# Patient Record
Sex: Female | Born: 1955 | Race: White | Hispanic: No | Smoking: Never smoker
Health system: Southern US, Community
[De-identification: ages and names within clinical notes are randomized; demographics above are authoritative.]

## PROBLEM LIST (undated history)

## (undated) HISTORY — PX: BREAST BIOPSY: SHX20

## (undated) HISTORY — PX: HERNIA REPAIR: SHX51

## (undated) HISTORY — PX: APPENDECTOMY: SHX54

---

## 1998-03-04 ENCOUNTER — Other Ambulatory Visit: Admission: RE | Admit: 1998-03-04 | Discharge: 1998-03-04 | Payer: Self-pay | Admitting: Obstetrics and Gynecology

## 1998-11-12 ENCOUNTER — Ambulatory Visit (HOSPITAL_BASED_OUTPATIENT_CLINIC_OR_DEPARTMENT_OTHER): Admission: RE | Admit: 1998-11-12 | Discharge: 1998-11-12 | Payer: Self-pay | Admitting: General Surgery

## 1999-05-06 ENCOUNTER — Other Ambulatory Visit: Admission: RE | Admit: 1999-05-06 | Discharge: 1999-05-06 | Payer: Self-pay | Admitting: Obstetrics and Gynecology

## 1999-05-26 ENCOUNTER — Encounter: Payer: Self-pay | Admitting: Obstetrics and Gynecology

## 2000-05-31 ENCOUNTER — Encounter: Payer: Self-pay | Admitting: Family Medicine

## 2000-05-31 ENCOUNTER — Other Ambulatory Visit: Admission: RE | Admit: 2000-05-31 | Discharge: 2000-05-31 | Payer: Self-pay | Admitting: Obstetrics and Gynecology

## 2000-05-31 ENCOUNTER — Ambulatory Visit (HOSPITAL_COMMUNITY): Admission: RE | Admit: 2000-05-31 | Discharge: 2000-05-31 | Payer: Self-pay | Admitting: Family Medicine

## 2001-06-05 ENCOUNTER — Other Ambulatory Visit: Admission: RE | Admit: 2001-06-05 | Discharge: 2001-06-05 | Payer: Self-pay | Admitting: Obstetrics and Gynecology

## 2002-06-10 ENCOUNTER — Other Ambulatory Visit: Admission: RE | Admit: 2002-06-10 | Discharge: 2002-06-10 | Payer: Self-pay | Admitting: Obstetrics and Gynecology

## 2003-03-08 ENCOUNTER — Emergency Department (HOSPITAL_COMMUNITY): Admission: EM | Admit: 2003-03-08 | Discharge: 2003-03-08 | Payer: Self-pay | Admitting: Emergency Medicine

## 2003-12-17 ENCOUNTER — Other Ambulatory Visit: Admission: RE | Admit: 2003-12-17 | Discharge: 2003-12-17 | Payer: Self-pay | Admitting: Obstetrics and Gynecology

## 2004-01-08 ENCOUNTER — Ambulatory Visit (HOSPITAL_COMMUNITY): Admission: RE | Admit: 2004-01-08 | Discharge: 2004-01-08 | Payer: Self-pay | Admitting: Obstetrics and Gynecology

## 2005-03-07 ENCOUNTER — Other Ambulatory Visit: Admission: RE | Admit: 2005-03-07 | Discharge: 2005-03-07 | Payer: Self-pay | Admitting: Obstetrics and Gynecology

## 2005-03-28 ENCOUNTER — Ambulatory Visit (HOSPITAL_COMMUNITY): Admission: RE | Admit: 2005-03-28 | Discharge: 2005-03-28 | Payer: Self-pay | Admitting: Obstetrics and Gynecology

## 2005-04-11 ENCOUNTER — Encounter: Admission: RE | Admit: 2005-04-11 | Discharge: 2005-04-11 | Payer: Self-pay | Admitting: Obstetrics and Gynecology

## 2005-04-22 ENCOUNTER — Other Ambulatory Visit: Admission: RE | Admit: 2005-04-22 | Discharge: 2005-04-22 | Payer: Self-pay | Admitting: Obstetrics and Gynecology

## 2006-07-05 ENCOUNTER — Encounter: Admission: RE | Admit: 2006-07-05 | Discharge: 2006-07-05 | Payer: Self-pay | Admitting: Obstetrics and Gynecology

## 2006-08-24 ENCOUNTER — Encounter: Admission: RE | Admit: 2006-08-24 | Discharge: 2006-08-24 | Payer: Self-pay | Admitting: Interventional Radiology

## 2010-05-22 ENCOUNTER — Encounter: Payer: Self-pay | Admitting: Obstetrics and Gynecology

## 2010-05-23 ENCOUNTER — Encounter: Payer: Self-pay | Admitting: Interventional Radiology

## 2011-07-13 ENCOUNTER — Other Ambulatory Visit: Payer: Self-pay | Admitting: Radiology

## 2014-07-15 ENCOUNTER — Other Ambulatory Visit: Payer: Self-pay | Admitting: Family Medicine

## 2014-07-15 DIAGNOSIS — Z1231 Encounter for screening mammogram for malignant neoplasm of breast: Secondary | ICD-10-CM

## 2014-07-22 ENCOUNTER — Other Ambulatory Visit: Payer: Self-pay | Admitting: Family Medicine

## 2014-07-22 DIAGNOSIS — N644 Mastodynia: Secondary | ICD-10-CM

## 2014-07-24 ENCOUNTER — Ambulatory Visit: Payer: Self-pay

## 2014-07-24 ENCOUNTER — Ambulatory Visit
Admission: RE | Admit: 2014-07-24 | Discharge: 2014-07-24 | Disposition: A | Discharge status: Home / Self Care | Payer: BLUE CROSS/BLUE SHIELD | Source: Ambulatory Visit | Attending: Family Medicine | Admitting: Family Medicine

## 2014-07-24 DIAGNOSIS — N644 Mastodynia: Secondary | ICD-10-CM

## 2015-03-09 ENCOUNTER — Other Ambulatory Visit: Payer: Self-pay | Admitting: Family Medicine

## 2015-03-09 DIAGNOSIS — N6320 Unspecified lump in the left breast, unspecified quadrant: Secondary | ICD-10-CM

## 2015-03-17 ENCOUNTER — Other Ambulatory Visit: Payer: Self-pay | Admitting: Family Medicine

## 2015-03-17 ENCOUNTER — Ambulatory Visit
Admission: RE | Admit: 2015-03-17 | Discharge: 2015-03-17 | Disposition: A | Discharge status: Home / Self Care | Payer: BLUE CROSS/BLUE SHIELD | Source: Ambulatory Visit | Attending: Family Medicine | Admitting: Family Medicine

## 2015-03-17 DIAGNOSIS — N6320 Unspecified lump in the left breast, unspecified quadrant: Secondary | ICD-10-CM

## 2015-05-19 ENCOUNTER — Encounter: Payer: Self-pay | Admitting: Cardiovascular Disease

## 2015-05-19 ENCOUNTER — Ambulatory Visit (INDEPENDENT_AMBULATORY_CARE_PROVIDER_SITE_OTHER): Payer: BLUE CROSS/BLUE SHIELD | Admitting: Cardiovascular Disease

## 2015-05-19 VITALS — BP 100/70 | HR 52 | Ht 65.5 in | Wt 134.8 lb

## 2015-05-19 DIAGNOSIS — I493 Ventricular premature depolarization: Secondary | ICD-10-CM | POA: Diagnosis not present

## 2015-05-19 DIAGNOSIS — R002 Palpitations: Secondary | ICD-10-CM

## 2015-05-19 NOTE — Progress Notes (Signed)
Cardiology Office Note   Date:  05/19/2015   ID:  [name], DOB 26-Sep-1955, MRN [id number]  PCP:  No primary care provider on file.  Cardiologist:   Marcy Bogosian, Deloris Ping, MD   Chief Complaint  Patient presents with  . Palpitations   Problem List 1. Palpitations    History of Present Illness: Kiara Lewis is a 60 y.o. female who presents for palpitations. These occur primarily at night. Not during the day .   Stays busy during the day . Lots of exercise.   Yoga 3 times a week.  Plays tennis 3-4 times a week .  Speed walks several times a week . Notices the palpitations every 3-4 weeks. Typically last only 1 second.   Has had them for years.  Not any worse now. Just thought it was time to check out the palpitations  No CP or dyspnea while exercising .     History reviewed. No pertinent past medical history.  Past Surgical History  Procedure Laterality Date  . Appendectomy    . Hernia repair       Current Outpatient Prescriptions  Medication Sig Dispense Refill  . cholecalciferol (VITAMIN D) 1000 units tablet Take 2,000 Units by mouth daily.    Marland Kitchen CINNAMON PO Take 1 tablet by mouth daily.    Marland Kitchen estradiol (ESTRACE) 2 MG tablet Take 2 mg by mouth daily.    . Fish Oil-Cholecalciferol (FISH OIL + D3) 1000-1000 MG-UNIT CAPS Take 1 tablet by mouth daily.    Marland Kitchen l-methylfolate-B6-B12 (METANX) 3-35-2 MG TABS tablet Take 1 tablet by mouth daily.    . magnesium 30 MG tablet Take 30 mg by mouth 2 (two) times daily.    . progesterone (PROMETRIUM) 100 MG capsule Take 100 mg by mouth daily.    . Thyroid (NATURE-THROID) 16.25 MG TABS Take 1 tablet by mouth daily.    . Turmeric Curcumin 500 MG CAPS Take 1 capsule by mouth daily.     No current facility-administered medications for this visit.    Allergies:   Review of patient's allergies indicates no known allergies.    Social History:  The patient  reports that she has never smoked. She does not have any smokeless tobacco  history on file. She reports that she drinks alcohol. She reports that she does not use illicit drugs.   Family History:  The patient's family history includes Atrial fibrillation in her brother and father; Cardiomyopathy in her brother; Diabetes type II in her mother; Rheumatic fever in her father.  Brother has hypertrophic cardiomyopathy .    ROS:  Please see the history of present illness.    Review of Systems: Constitutional:  denies fever, chills, diaphoresis, appetite change and fatigue.  HEENT: denies photophobia, eye pain, redness, hearing loss, ear pain, congestion, sore throat, rhinorrhea, sneezing, neck pain, neck stiffness and tinnitus.  Respiratory: denies SOB, DOE, cough, chest tightness, and wheezing.  Cardiovascular: denies chest pain, palpitations and leg swelling.  Gastrointestinal: denies nausea, vomiting, abdominal pain, diarrhea, constipation, blood in stool.  Genitourinary: denies dysuria, urgency, frequency, hematuria, flank pain and difficulty urinating.  Musculoskeletal: denies  myalgias, back pain, joint swelling, arthralgias and gait problem.   Skin: denies pallor, rash and wound.  Neurological: denies dizziness, seizures, syncope, weakness, light-headedness, numbness and headaches.   Hematological: denies adenopathy, easy bruising, personal or family bleeding history.  Psychiatric/ Behavioral: denies suicidal ideation, mood changes, confusion, nervousness, sleep disturbance and agitation.       All  other systems are reviewed and negative.    PHYSICAL EXAM: VS:  BP 100/70 mmHg  Pulse 52  Ht 5' 5.5" (1.664 m)  Wt 134 lb 12.8 oz (61.145 kg)  BMI 22.08 kg/m2 , BMI Body mass index is 22.08 kg/(m^2). GEN: Well nourished, well developed, in no acute distress HEENT: normal Neck: no JVD, carotid bruits, or masses Cardiac: RRR; no murmurs, rubs, or gallops,no edema  Respiratory:  clear to auscultation bilaterally, normal work of breathing GI: soft, nontender,  nondistended, + BS MS: no deformity or atrophy Skin: warm and dry, no rash Neuro:  Strength and sensation are intact Psych: normal   EKG:  EKG is ordered today. The ekg ordered today demonstrates sinus brady at 52.  Otherwise normal .   Recent Labs: No results found for requested labs within last 365 days.    Lipid Panel No results found for: CHOL, TRIG, HDL, CHOLHDL, VLDL, LDLCALC, LDLDIRECT    Wt Readings from Last 3 Encounters:  05/19/15 134 lb 12.8 oz (61.145 kg)      Other studies Reviewed: Additional studies/ records that were reviewed today include: . Review of the above records demonstrates:    ASSESSMENT AND PLAN:  1.  Palpitations: Kiara Lewis presents with episodes of palpitations that are clinically consistent with premature ventricular contractions. These typically occur more in the evening and do not typically occur at night. They last for only one second. She also has muscle cramps. I suspect that her potassium levels may be a little bit low. I suggested to her that she drink V8 juice once a day.  These palpitations certainly sounds very benign. They do not cause her any chest pain, shortness breath, syncope, or presyncope. She is able to percuss pain in competitive sports without any limitations of shortness of breath or chest pain.  At this time I do not think that she needs any additional workup. I'll be happy to see her in the future if she has any additional problems. Her brother and her father have atrial fibrillation. I've advised her to watch for any prolonged episodes of palpitations and told her that I would be happy to see her again if she has any prolonged episodes of heartbeat irregularities.   Current medicines are reviewed at length with the patient today.  The patient does not have concerns regarding medicines.  The following changes have been made:  no change  Labs/ tests ordered today include:  No orders of the defined types were placed in  this encounter.     Disposition:   FU with me as needed.     Sachi Boulay, Deloris Ping, MD  05/19/2015 10:22 AM    [facility] [address] [place], [place], [place]  [address] Phone: [phone]; Fax: [phone]   [facility]  [address] [place], [place]  [address] [phone]   Fax [phone]

## 2015-05-19 NOTE — Patient Instructions (Signed)
Medication Instructions:  Your physician recommends that you continue on your current medications as directed. Please refer to the Current Medication list given to you today.   Labwork: None Ordered   Testing/Procedures: None Ordered   Follow-Up: Your physician recommends that you schedule a follow-up appointment in: as needed with Dr. Nahser   If you need a refill on your cardiac medications before your next appointment, please call your pharmacy.   Thank you for choosing [facility]! Roshard Rezabek, RN [phone]    

## 2015-05-20 ENCOUNTER — Encounter: Payer: Self-pay | Admitting: Nurse Practitioner

## 2015-05-22 ENCOUNTER — Encounter: Payer: Self-pay | Admitting: Cardiovascular Disease

## 2015-10-28 DIAGNOSIS — E559 Vitamin D deficiency, unspecified: Secondary | ICD-10-CM | POA: Diagnosis not present

## 2015-10-28 DIAGNOSIS — E039 Hypothyroidism, unspecified: Secondary | ICD-10-CM | POA: Diagnosis not present

## 2015-10-28 DIAGNOSIS — N951 Menopausal and female climacteric states: Secondary | ICD-10-CM | POA: Diagnosis not present

## 2015-10-28 DIAGNOSIS — R002 Palpitations: Secondary | ICD-10-CM | POA: Diagnosis not present

## 2015-11-11 DIAGNOSIS — E039 Hypothyroidism, unspecified: Secondary | ICD-10-CM | POA: Diagnosis not present

## 2015-11-11 DIAGNOSIS — R6882 Decreased libido: Secondary | ICD-10-CM | POA: Diagnosis not present

## 2015-11-11 DIAGNOSIS — N951 Menopausal and female climacteric states: Secondary | ICD-10-CM | POA: Diagnosis not present

## 2016-01-10 DIAGNOSIS — M25552 Pain in left hip: Secondary | ICD-10-CM | POA: Diagnosis not present

## 2016-01-10 DIAGNOSIS — M791 Myalgia: Secondary | ICD-10-CM | POA: Diagnosis not present

## 2016-01-10 DIAGNOSIS — Z6822 Body mass index (BMI) 22.0-22.9, adult: Secondary | ICD-10-CM | POA: Diagnosis not present

## 2016-01-10 DIAGNOSIS — S76312A Strain of muscle, fascia and tendon of the posterior muscle group at thigh level, left thigh, initial encounter: Secondary | ICD-10-CM | POA: Diagnosis not present

## 2016-01-14 DIAGNOSIS — M545 Low back pain: Secondary | ICD-10-CM | POA: Diagnosis not present

## 2016-01-14 DIAGNOSIS — Z6822 Body mass index (BMI) 22.0-22.9, adult: Secondary | ICD-10-CM | POA: Diagnosis not present

## 2016-01-25 DIAGNOSIS — M25552 Pain in left hip: Secondary | ICD-10-CM | POA: Diagnosis not present

## 2016-01-25 DIAGNOSIS — G5792 Unspecified mononeuropathy of left lower limb: Secondary | ICD-10-CM | POA: Diagnosis not present

## 2016-01-25 DIAGNOSIS — Z6822 Body mass index (BMI) 22.0-22.9, adult: Secondary | ICD-10-CM | POA: Diagnosis not present

## 2016-01-25 DIAGNOSIS — M79605 Pain in left leg: Secondary | ICD-10-CM | POA: Diagnosis not present

## 2016-01-26 DIAGNOSIS — M545 Low back pain: Secondary | ICD-10-CM | POA: Diagnosis not present

## 2016-02-17 ENCOUNTER — Ambulatory Visit (INDEPENDENT_AMBULATORY_CARE_PROVIDER_SITE_OTHER): Payer: BLUE CROSS/BLUE SHIELD | Admitting: Family Medicine

## 2016-02-17 ENCOUNTER — Ambulatory Visit: Payer: Self-pay

## 2016-02-17 ENCOUNTER — Encounter: Payer: Self-pay | Admitting: Family Medicine

## 2016-02-17 VITALS — BP 114/82 | HR 66 | Wt 130.0 lb

## 2016-02-17 DIAGNOSIS — G5702 Lesion of sciatic nerve, left lower limb: Secondary | ICD-10-CM | POA: Diagnosis not present

## 2016-02-17 DIAGNOSIS — M25552 Pain in left hip: Secondary | ICD-10-CM

## 2016-02-17 DIAGNOSIS — S76312A Strain of muscle, fascia and tendon of the posterior muscle group at thigh level, left thigh, initial encounter: Secondary | ICD-10-CM | POA: Diagnosis not present

## 2016-02-17 MED ORDER — NITROGLYCERIN 0.2 MG/HR TD PT24: MEDICATED_PATCH | TRANSDERMAL | 1 refills | Status: DC

## 2016-02-17 MED ORDER — GABAPENTIN 100 MG PO CAPS: 200.0000 mg | ORAL_CAPSULE | Freq: Every day | ORAL | 3 refills | Status: DC

## 2016-02-17 MED ORDER — DICLOFENAC SODIUM 2 % TD SOLN: TRANSDERMAL | 3 refills | Status: DC

## 2016-02-17 NOTE — Progress Notes (Signed)
[facility] [address] [place], [place] [address] Phone: [phone] Subjective:    CC: Back, hip and leg pain  YQM:[id number]  Kiara Lewis is a 60 y.o. female coming in with complaint of back, hip and leg pain. Seems to be left-sided. Patient states this all started when she was pushing furniture. Left leg felt a snap in the back of the leg. Having difficult he with daily activities including working as well as driving. Seems to radiate down the posterior aspect the leg to the knee. Mild numbness but denies any weakness. Patient states that she did have some mild response to the steroid as well as a muscle relaxer. Patient went to another specialist. Was diagnosed with more of a piriformis syndrome and has been doing stretching regularly with no significant improvement. States that she continues to have a pain that seems to radiate down the posterior aspect the leg down to her knee. Rates the severity of pain a 6 out of 10. Given her from playing tennis. Is able to walk. States though that she can wake up in the middle of the night with spasming. No significant back pain.   patient was seen by [facility] one month ago. Patient did have x-rays of her left hip done. These were independently visualized by me. Patient did not have any significant osteoarthritic changes but was found to have calcified fibroid.  No past medical history on file. Past Surgical History:  Procedure Laterality Date  . APPENDECTOMY    . HERNIA REPAIR     Social History   Social History  . Marital status: Divorced    Spouse name: N/A  . Number of children: N/A  . Years of education: N/A   Social History Main Topics  . Smoking status: Never Smoker  . Smokeless tobacco: Not on file  . Alcohol use Yes     Comment: OCCASSIONALLY  . Drug use: No  . Sexual activity: Not on file   Other Topics Concern  . Not on file   Social History Narrative  . No narrative on file   No Known  Allergies Family History  Problem Relation Age of Onset  . Diabetes type II Mother   . Atrial fibrillation Father   . Rheumatic fever Father   . Atrial fibrillation Brother   . Cardiomyopathy Brother     Past medical history, social, surgical and family history all reviewed in electronic medical record.  No pertanent information unless stated regarding to the chief complaint.   Review of Systems: No headache, visual changes, nausea, vomiting, diarrhea, constipation, dizziness, abdominal pain, skin rash, fevers, chills, night sweats, weight loss, swollen lymph nodes, body aches, joint swelling, muscle aches, chest pain, shortness of breath, mood changes.   Objective  There were no vitals taken for this visit.  General: No apparent distress alert and oriented x3 mood and affect normal, dressed appropriately.  HEENT: Pupils equal, extraocular movements intact  Respiratory: Patient's speak in full sentences and does not appear short of breath  Cardiovascular: No lower extremity edema, non tender, no erythema  Skin: Warm dry intact with no signs of infection or rash on extremities or on axial skeleton.  Abdomen: Soft nontender  Neuro: Cranial nerves II through XII are intact, neurovascularly intact in all extremities with 2+ DTRs and 2+ pulses.  Lymph: No lymphadenopathy of posterior or anterior cervical chain or axillae bilaterally.  Gait normal with good balance and coordination.  MSK:  Non tender  with full range of motion and good stability and symmetric strength and tone of shoulders, elbows, wrist,  knee and ankles bilaterally.  Back Exam:  Inspection: Unremarkable  Motion: Flexion 45 deg, Extension 25 deg, Side Bending to 45 deg bilaterally,  Rotation to 45 deg bilaterally  SLR laying: Negative  XSLR laying: Negative  Palpable tenderness: Tender to palpation in the paraspinal musculature as well as the piriformis muscle.. Severely tender to palpation in the mid substance of the left  hamstring. FABER: Mild positive left side Sensory change: Gross sensation intact to all lumbar and sacral dermatomes.  Reflexes: 2+ at both patellar tendons, 2+ at achilles tendons, Babinski's downgoing.  Strength at foot  Plantar-flexion: 5/5 Dorsi-flexion: 5/5 Eversion: 5/5 Inversion: 5/5  Leg strength  Quad: 5/5 Hamstring: 4/5 Hip flexor: 5/5 Hip abductors: 5/5  Gait unremarkable.  Limited musculoskeletal ultrasound was performed and interpreted by Judi SaaZachary M Jennika Ringgold  Limited ultrasound the patient's left hamstring shows the patient does have scar tissue formation in the mid substance. Increasing Doppler flow and hypoechoic changes in the surrounding area still. No avulsion fracture noted proximally. Patient though does have mild hypoechoic changes of the piriformis noted but no true tear appreciated. Impression: Slow to delayed healing hamstring tear   Procedure note   97110; 15 minutes spent for Therapeutic exercises as stated in above notes.  This included exercises focusing on stretching, strengthening, with significant focus on eccentric aspects.Piriformis Syndrome  Using an anatomical model, reviewed with the patient the structures involved and how they related to diagnosis. The patient indicated understanding.   The patient was given a handout from Dr. Ailene Ardsouzier's book "The Sports Medicine Patient Advisor" describing the anatomy and rehabilitation of the following condition: Piriformis Syndrome  Also given a handout with more extensive Piriformis stretching, hip flexor and abductor strengthening, ham stretching  Rec deep massage, explained self-massage with ball    Proper technique shown and discussed handout in great detail with ATC.  All questions were discussed and answered.   Impression and Recommendations:     This case required medical decision making of moderate complexity.      Note: This dictation was prepared with Dragon dictation along with smaller phrase  technology. Any transcriptional errors that result from this process are unintentional.

## 2016-02-17 NOTE — Assessment & Plan Note (Addendum)
Piriformis Syndrome  Using an anatomical model, reviewed with the patient the structures involved and how they related to diagnosis. The patient indicated understanding.   The patient was given a handout from Dr. Ailene Ardsouzier's book "The Sports Medicine Patient Advisor" describing the anatomy and rehabilitation of the following condition: Piriformis Syndrome  Also given a handout with more extensive Piriformis stretching, hip flexor and abductor strengthening, ham stretching  Rec deep massage, explained self-massage with ball RTC in 2-3 weeks.  Differential includes lumbar radiculopathy but has had x-rays from outside facility that was unremarkable.

## 2016-02-17 NOTE — Assessment & Plan Note (Signed)
Mid substance tear of the hamstring. Scar tissue formation noted with mild increase in Doppler flow. Patient though is not healing as quickly she would like. Started on nitroglycerin as well as gabapentin for the radicular symptoms. We discussed how the piriformis is contribute in. Patient work with Event organiserathletic trainer to learn home exercises in greater detail. Discussed icing regimen. Discussed compression. Follow-up in 2-3 weeks for further evaluation. Avoid any tenderness at this time.

## 2016-02-17 NOTE — Patient Instructions (Addendum)
Good to see you.  Ice 20 minutes 2 times daily. Usually after activity and before bed. Exercises 3 times a week.  Gabapentin 200mg  at night pennsaid pinkie amount topically 2 times daily as needed.  Physical therapy will be calling you  Nitroglycerin Protocol   Apply 1/4 nitroglycerin patch to affected area daily.  Change position of patch within the affected area every 24 hours.  You may experience a headache during the first 1-2 weeks of using the patch, these should subside.  If you experience headaches after beginning nitroglycerin patch treatment, you may take your preferred over the counter pain reliever.  Another side effect of the nitroglycerin patch is skin irritation or rash related to patch adhesive.  Please notify our office if you develop more severe headaches or rash, and stop the patch.  Tendon healing with nitroglycerin patch may require 12 to 24 weeks depending on the extent of injury.  Men should not use if taking Viagra, Cialis, or Levitra.   Do not use if you have migraines or rosacea.  Vitamin D 2000 IU daily  Turmeric 500mg  twice daily  Thigh compression sleeve with activity (look at [employer] or [employer]) See me again in 2 weeks and if doing better we will get you back on the court.

## 2016-02-23 ENCOUNTER — Ambulatory Visit: Payer: BLUE CROSS/BLUE SHIELD | Attending: Family Medicine | Admitting: Physical Therapy

## 2016-02-23 DIAGNOSIS — M79652 Pain in left thigh: Secondary | ICD-10-CM | POA: Diagnosis not present

## 2016-02-23 DIAGNOSIS — M6281 Muscle weakness (generalized): Secondary | ICD-10-CM | POA: Diagnosis not present

## 2016-02-23 NOTE — Therapy (Addendum)
[facility] [address]  [place], [place], [address] Phone: [phone]   Fax:  [phone]  Physical Therapy Evaluation  Patient Details  Name: RETINA BERNARDY MRN: [id number] Date of Birth: 11-07-1955 Referring Provider: Dr. Charlann Boxer  Encounter Date: 02/23/2016      PT End of Session - 02/23/16 1108    Visit Number 1   Number of Visits 4   Date for PT Re-Evaluation 04/19/16   Authorization - Number of Visits 30   PT Start Time 1015   PT Stop Time 1101   PT Time Calculation (min) 46 min   Activity Tolerance Patient tolerated treatment well   Behavior During Therapy [facility] for tasks assessed/performed      No past medical history on file.  Past Surgical History:  Procedure Laterality Date  . APPENDECTOMY    . HERNIA REPAIR      There were no vitals filed for this visit.       Subjective Assessment - 02/23/16 1020    Subjective Patient is a 60 y/o female reporting that approx 2 months ago she was riding bike and felt as if muscles were extremely tight; patient reporting 1 fall forward with no associated injuries. Patient stating she was pushing furniture per job requirements and heard a pop in back of L posterior upper thigh. Primary symptoms initiated in mid glut region with radiating symptoms into posterior leg. Patient has tried accupuncture and feels as if "made other things worse." Patient emotional regrading missing work and family events due to pain.    Limitations Sitting   How long can you sit comfortably? 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 Yes   Pain Score 2   after sitting for 20 minutes up to 7/10   Pain Location Leg   Pain Orientation Posterior;Left;Upper   Pain Descriptors / Indicators Tightness   Pain Type --  subacute   Pain Onset More than a month ago   Pain  Frequency Constant   Aggravating Factors  sitting, sleeping, bending over   Pain Relieving Factors piriformis stretch, clamshells            [facility] PT Assessment - 02/23/16 1028      Assessment   Medical Diagnosis L piriformis syndrome   Referring Provider Dr. Charlann Boxer   Onset Date/Surgical Date --  2 months ago   Next MD Visit February 29 2016   Prior Therapy no formal PT but exercises     Precautions   Precaution Comments proper stretch holds     Restrictions   Weight Bearing Restrictions No     Balance Screen   Has the patient fallen in the past 6 months Yes   How many times? 1   Has the patient had a decrease in activity level because of a fear of falling?  No   Is the patient reluctant to leave their home because of a fear of falling?  No     Home Environment   Living Environment Private residence   Living Arrangements Alone   Type of [place] Access Stairs to enter   Entrance Stairs-Number of Steps 7   Entrance Stairs-Rails None   Home Layout Two level;Bed/bath upstairs   Alternate Level Stairs-Number of Steps 14   Alternate Level Stairs-Rails Right  Prior Function   Level of Independence Independent   Vocation Full time employment;Self employed   [employer], lamps, actiivty varies between sitting and standing    Leisure yoga, tennis     Cognition   Overall Cognitive Status Within Functional Limits for tasks assessed     Observation/Other Assessments   Focus on Therapeutic Outcomes (FOTO)  42 (58% limited, predicted 37% limited)     Strength   Right Hip Flexion 4/5   Right Hip Extension 5/5   Right Hip ABduction 5/5   Right Hip ADduction 5/5   Left Hip Flexion 3/5   Left Hip Extension 4/5   Left Hip ABduction 4/5   Left Hip ADduction 5/5   Right Knee Flexion 3/5   Right Knee Extension 5/5   Left Knee Flexion 3/5   Left Knee Extension 5/5     Flexibility   Hamstrings L ~60 degrees      Palpation    Palpation comment tenderness along L glut med, piriformis                    [facility] Adult PT Treatment/Exercise - 02/23/16 1047      Exercises   Exercises Knee/Hip     Knee/Hip Exercises: Stretches   Passive Hamstring Stretch Left;1 rep;30 seconds   Passive Hamstring Stretch Limitations supine with sheet   Piriformis Stretch Left;30 seconds   Piriformis Stretch Limitations 3 way stretch     Knee/Hip Exercises: Supine   Single Leg Bridge Left;5 reps  for HEP instruction     Knee/Hip Exercises: Sidelying   Hip ABduction Left;5 reps   Hip ABduction Limitations for HEP instruction   [place] issued green tband to self progress                PT Education - 02/23/16 1107    Education provided Yes   Education Details initial HEP with handout given   Person(s) Educated Patient   Methods Explanation;Demonstration;Verbal cues;Handout   Comprehension Verbalized understanding;Returned demonstration          PT Short Term Goals - 02/23/16 1334      PT SHORT TERM GOAL #1   Title independent with initial HEP (03/22/16)   Time 4   Period Weeks   Status New           PT Long Term Goals - 02/23/16 1334      PT LONG TERM GOAL #1   Title independent with advanced HEP (04/19/16)   Time 8   Period Weeks   Status New     PT LONG TERM GOAL #2   Title Pt to report ability to do laundry without increase in pain (04/19/16)   Time 8   Period Weeks   Status New     PT LONG TERM GOAL #3   Title Pt able to sit at desk to perform work duties greater than 1 hr without increase in pain (04/19/16)   Time 8   Period Weeks   Status New     PT LONG TERM GOAL #4   Title pt to demonstrate SLR to 80 degrees on L LE in order to return to yoga (04/19/16)   Time 8   Period Weeks   Status New               Plan - 02/23/16 1109    Clinical Impression Statement Patient is a 60 y/o female presenting to [facility] with primary complaints of L posterior  thigh pain and  tightness with sitting and sleeping being most aggravating factors. Patient today demonstrating decreased L hamstring flexibility as well as piriformis tightness with tenderness to palpation over L glut med and piriformis region consistent with signs and symptoms of Piriformis Syndrome. Patient to benefit from skilled PT to enhance flexibility, improve strength and reduce pain to allow patient to return to work and leisure activities. Recommend PT for 2x/week for 6 weeks, however, patient requesting limited visits as insurance coverage is limted and patient is unable to afford recommended treatment duration. Will plan to see prn up to 8 weeks.    Rehab Potential Good   PT Frequency Biweekly   PT Duration 8 weeks   PT Treatment/Interventions Cryotherapy;Electrical Stimulation;Moist Heat;Therapeutic activities;Therapeutic exercise;Manual techniques;Patient/family education;ADLs/Self Care Home Management;Ultrasound;Functional mobility training;Taping;Dry needling   PT Next Visit Plan review HEP and progrress as indicated.    Consulted and Agree with Plan of Care Patient      Patient will benefit from skilled therapeutic intervention in order to improve the following deficits and impairments:  Decreased strength, Pain, Impaired flexibility, Decreased activity tolerance  Visit Diagnosis: Pain in left thigh - Plan: PT plan of care cert/re-cert  Muscle weakness (generalized) - Plan: PT plan of care cert/re-cert     Problem List Patient Active Problem List   Diagnosis Date Noted  . Tear of left hamstring 02/17/2016  . Piriformis syndrome of left side 02/17/2016  . Palpitations 05/19/2015    Laureen Abrahams, PT, DPT 02/23/16 1:47 PM  [facility] [address]  [place], [place], [address] Phone: [phone]   Fax:  [phone]  Name: MAJESTA LEICHTER MRN: [id number] Date of Birth: [birth year]     PHYSICAL THERAPY  DISCHARGE SUMMARY  Visits from Start of Care: 1  Current functional level related to goals / functional outcomes: See above   Remaining deficits: unknown   Education / Equipment: HEP  Plan: Patient agrees to discharge.  Patient goals were not met. Patient is being discharged due to not returning since the last visit.  ?????     Laureen Abrahams, PT, DPT 04/13/16 2:35 PM  [facility] [place], [place] [address]  [phone] (office) [phone] (fax)

## 2016-02-23 NOTE — Patient Instructions (Signed)
Hamstring Step 2    Left foot relaxed, knee straight, other leg bent, foot flat. Raise straight leg further upward to maximal range. Hold _20-30__ seconds. Relax leg completely down. Repeat _2-3__ times.  Copyright  VHI. All rights reserved.    Piriformis Stretch    Lying on back, pull right knee toward opposite shoulder. Hold _20-30___ seconds. Repeat _2-3___ times. Do __1-2__ sessions per day.  http://gt2.exer.us/258   Copyright  VHI. All rights reserved.     Bridge Pose, One Leg    Bring knee to chest. Roll up from tailbone to bridge pose on supporting leg. Focus on engaging posterior hip muscles.  Repeat _2-3___ times each leg.  Copyright  VHI. All rights reserved.    ABDUCTION: Side-Lying (Active)    Lie on left side, top leg straight. Raise top leg as far as possible. Complete __1_ sets of _10-15__ repetitions. Perform _1-2__ sessions per day.  http://gtsc.exer.us/95   Copyright  VHI. All rights reserved.

## 2016-02-28 NOTE — Progress Notes (Signed)
Tawana ScaleZach Zoanne Newill D.O.  Sports Medicine [address] [place], [place] [address] Phone: [phone] Subjective:    CC: Back, hip and leg pain f/u   QVZ:[id number]:Subjective   Previous info from previous visit.  Luther RedoDeborah L Scism is a 60 y.o. female coming in with complaint of back, hip and leg pain. Seems to be left-sided. Patient states this all started when she was pushing furniture. Left leg felt a snap in the back of the leg. Having difficult he with daily activities including working as well as driving. Seems to radiate down the posterior aspect the leg to the knee. Mild numbness but denies any weakness. Patient states that she did have some mild response to the steroid as well as a muscle relaxer. Patient went to another specialist. Was diagnosed with more of a piriformis syndrome and has been doing stretching regularly with no significant improvement. States that she continues to have a pain that seems to radiate down the posterior aspect the leg down to her knee. Rates the severity of pain a 6 out of 10. Given her from playing tennis. Is able to walk. States though that she can wake up in the middle of the night with spasming. No significant back pain.   [facility] Patient did have x-rays of her left hip done. These were independently visualized by me. Patient did not have any significant osteoarthritic changes but was found to have calcified fibroid.  02/29/2016 update: Patient was to do home exercises, was with formal physical therapy. Was started on gabapentin. Patient was also started on nitroglycerin. Stated that she has had headaches with the nitroglycerin but not to any other significant side effects. Gabapentin did make her feel funny so discontinued after 2 days. Patient states that she continues to have the same discomfort. Seems to be more in the buttocks region radiating down the leg. Patient states that there is no weakness but she is missing on things in life  including seen her mother turned [id number]. Patient states that it is also affecting her work. Unable to sit for longer than 20 minutes without severe pain.        Past Surgical History:  Procedure Laterality Date  . APPENDECTOMY    . HERNIA REPAIR     Social History        Social History  . Marital status: Divorced    Spouse name: N/A  . Number of children: N/A  . Years of education: N/A         Social History Main Topics  . Smoking status: Never Smoker  . Smokeless tobacco: Not on file  . Alcohol use Yes     Comment: OCCASSIONALLY  . Drug use: No  . Sexual activity: Not on file       Other Topics Concern  . Not on file      Social History Narrative  . No narrative on file   No Known Allergies      Family History  Problem Relation Age of Onset  . Diabetes type II Mother   . Atrial fibrillation Father   . Rheumatic fever Father   . Atrial fibrillation Brother   . Cardiomyopathy Brother     Past medical history, social, surgical and family history all reviewed in electronic medical record.  No pertanent information unless stated regarding to the chief complaint.   Review of Systems: No headache, visual changes, nausea, vomiting, diarrhea, constipation, dizziness, abdominal pain, skin rash, fevers,  chills, night sweats, weight loss, swollen lymph nodes, body aches, joint swelling, muscle aches, chest pain, shortness of breath, mood changes.   Objective  Blood pressure 110/70, pulse 80, height 5' 5.75" (1.67 m), weight 133 lb (60.3 kg).  Systems examined below as of 02/29/16   General: No apparent distress alert and oriented x3 mood and affect normal, dressed appropriately.  HEENT: Pupils equal, extraocular movements intact  Respiratory: Patient's speak in full sentences and does not appear short of breath  Cardiovascular: No lower extremity edema, non tender, no erythema  Skin: Warm dry intact with no signs of infection or rash on extremities  or on axial skeleton.  Abdomen: Soft nontender  Neuro: Cranial nerves II through XII are intact, neurovascularly intact in all extremities with 2+ DTRs and 2+ pulses.  Lymph: No lymphadenopathy of posterior or anterior cervical chain or axillae bilaterally.  Gait Mild antalgic gait that is new..  MSK:  Non tender with full range of motion and good stability and symmetric strength and tone of shoulders, elbows, wrist,  knee and ankles bilaterally.     Back Exam:  Inspection: Unremarkable  Motion: Flexion 45 deg, Extension 25 deg, Side Bending to 45 deg bilaterally,  Rotation to 45 deg bilaterally  SLR laying: Negative  XSLR laying: Negative  Palpable tenderness: Tender to palpation in the  piriformis muscle.. Severely tender in the piriformis compared to the hamstring. Only mild tenderness in the hamstring at this time. FABER: Worsening at this time of the left side Sensory change: Gross sensation intact to all lumbar and sacral dermatomes.  Reflexes: 2+ at both patellar tendons, 2+ at achilles tendons, Babinski's downgoing.  Strength at foot  Plantar-flexion: 5/5 Dorsi-flexion: 5/5 Eversion: 5/5 Inversion: 5/5  Leg strength  Quad: 5/5 Hamstring: 4/5 Hip flexor: 5/5 Hip abductors: 5/5  Gait unremarkable.  Limited muscular skeletal ultrasound was performed and interpreted by Judi SaaZachary M Britt Petroni  Limited ultrasound the patient's hamstring shows patient area where the tear was previously is completely resolved at this time. Scar tissue formation still noted with very mild increase in Doppler flow. Impression: Healing hamstring tear  Procedure: Real-time Ultrasound Guided Injection of left piriformis tendon Device: GE Logiq E  Ultrasound guided injection is preferred based studies that show increased duration, increased effect, greater accuracy, decreased procedural pain, increased response rate, and decreased cost with ultrasound guided versus blind injection.  Verbal informed consent  obtained.  Time-out conducted.  Noted no overlying erythema, induration, or other signs of local infection.  Skin prepped in a sterile fashion.  Local anesthesia: Topical Ethyl chloride.  With sterile technique and under real time ultrasound guidance:   Completed without difficulty  Pain immediately resolved suggesting accurate placement of the medication.  Advised to call if fevers/chills, erythema, induration, drainage, or persistent bleeding.  Images permanently stored and available for review in the ultrasound unit.  Impression: Technically successful ultrasound guided injection.

## 2016-02-29 ENCOUNTER — Encounter: Payer: Self-pay | Admitting: Family Medicine

## 2016-02-29 ENCOUNTER — Ambulatory Visit (INDEPENDENT_AMBULATORY_CARE_PROVIDER_SITE_OTHER): Payer: BLUE CROSS/BLUE SHIELD | Admitting: Family Medicine

## 2016-02-29 ENCOUNTER — Ambulatory Visit: Payer: Self-pay

## 2016-02-29 VITALS — BP 110/70 | HR 80 | Ht 65.75 in | Wt 133.0 lb

## 2016-02-29 DIAGNOSIS — S76312D Strain of muscle, fascia and tendon of the posterior muscle group at thigh level, left thigh, subsequent encounter: Secondary | ICD-10-CM | POA: Diagnosis not present

## 2016-02-29 DIAGNOSIS — M79605 Pain in left leg: Secondary | ICD-10-CM | POA: Diagnosis not present

## 2016-02-29 DIAGNOSIS — G5702 Lesion of sciatic nerve, left lower limb: Secondary | ICD-10-CM | POA: Diagnosis not present

## 2016-02-29 MED ORDER — NORTRIPTYLINE HCL 10 MG PO CAPS: 10.0000 mg | ORAL_CAPSULE | Freq: Every day | ORAL | 1 refills | Status: DC

## 2016-02-29 NOTE — Assessment & Plan Note (Signed)
Worsening symptoms. Patient was given an injection for diagnostic as well as hopefully therapeutic intervention. Patient was feeling significantly better after the injection. No significant weakness or numbness noted after the injection. Patient is going to continue with conservative therapy including home exercises with formal physical therapy causing too much. Unable to tolerate the gabapentin so did start her on nortriptyline. Patient continues to have difficulty we may need to consider further imaging including MRI of the back as well as the hip secondary to longevity of all this issue.

## 2016-02-29 NOTE — Patient Instructions (Signed)
Good to see you  We injected your piriformis today  I am really hoping this does well Move the nitro to higher up at this time.  I am hoping you will do very well.  Lets try nortriptyline at night  See me again in 2 weeks and if not a lot better we may need the MRI

## 2016-02-29 NOTE — Assessment & Plan Note (Signed)
Healing and moment. Continue current therapy with patient on the nitroglycerin patches. No increase in amount at this time. Continue topical anti-inflammatories and compression. Follow-up again in 2-4 weeks.

## 2016-03-15 ENCOUNTER — Ambulatory Visit: Payer: BLUE CROSS/BLUE SHIELD | Admitting: Physical Therapy

## 2016-03-17 DIAGNOSIS — K589 Irritable bowel syndrome without diarrhea: Secondary | ICD-10-CM | POA: Diagnosis not present

## 2016-03-17 DIAGNOSIS — R41840 Attention and concentration deficit: Secondary | ICD-10-CM | POA: Diagnosis not present

## 2016-03-17 DIAGNOSIS — E039 Hypothyroidism, unspecified: Secondary | ICD-10-CM | POA: Diagnosis not present

## 2016-03-17 DIAGNOSIS — E559 Vitamin D deficiency, unspecified: Secondary | ICD-10-CM | POA: Diagnosis not present

## 2016-03-17 DIAGNOSIS — R5381 Other malaise: Secondary | ICD-10-CM | POA: Diagnosis not present

## 2016-03-17 DIAGNOSIS — N951 Menopausal and female climacteric states: Secondary | ICD-10-CM | POA: Diagnosis not present

## 2016-06-03 ENCOUNTER — Telehealth: Payer: Self-pay | Admitting: Emergency Medicine

## 2016-06-03 MED ORDER — MELOXICAM 15 MG PO TABS: 15.0000 mg | ORAL_TABLET | Freq: Every day | ORAL | 0 refills | Status: DC

## 2016-06-03 NOTE — Telephone Encounter (Signed)
Sent in meloxicam daily until I see her again.

## 2016-06-03 NOTE — Telephone Encounter (Signed)
Pt made aware

## 2016-06-03 NOTE — Telephone Encounter (Signed)
Pt called and states she is having nerve pain. She has made an appt for Monday 2/5 since there was no available appts today. She is wondering if you can call something in for pain to get her through the weekend. Please advise thanks.

## 2016-06-04 DIAGNOSIS — Z6821 Body mass index (BMI) 21.0-21.9, adult: Secondary | ICD-10-CM | POA: Diagnosis not present

## 2016-06-04 DIAGNOSIS — M549 Dorsalgia, unspecified: Secondary | ICD-10-CM | POA: Diagnosis not present

## 2016-06-04 NOTE — Progress Notes (Signed)
Kiara ScaleZach Chenae Lewis D.O.  Sports Medicine [address] [place], [place] [address] Phone: [phone] Subjective:    CC: Back, hip and leg pain f/u   IHK:[id number]:Subjective   Previous info from previous visit.  [name] is a 61 y.o. female coming in with complaint of back, hip and leg pain. Seems to be left-sided. Patient states this all started when she was pushing furniture. Left leg felt a snap in the back of the leg. Having difficult he with daily activities including working as well as driving. Seems to radiate down the posterior aspect the leg to the knee. Mild numbness but denies any weakness. Patient states that she did have some mild response to the steroid as well as a muscle relaxer. Patient went to another specialist. Was diagnosed with more of a piriformis syndrome and has been doing stretching regularly with no significant improvement. States that she continues to have a pain that seems to radiate down the posterior aspect the leg down to her knee. Rates the severity of pain a 6 out of 10. Given her from playing tennis. Is able to walk. States though that she can wake up in the middle of the night with spasming. No significant back pain.   [facility]. Patient did have x-rays of her left hip done. These were independently visualized by me. Patient did not have any significant osteoarthritic changes but was found to have calcified fibroid.  02/29/2016 update: Patient was to do home exercises, was with formal physical therapy. Was started on gabapentin. Patient was also started on nitroglycerin. Stated that she has had headaches with the nitroglycerin but not to any other significant side effects. Gabapentin did make her feel funny so discontinued after 2 days. Patient states that she continues to have the same discomfort. Seems to be more in the buttocks region radiating down the leg. Patient states that there is no weakness but she is missing on things in life  including seen her mother turned 1090. Patient states that it is also affecting her work. Unable to sit for longer than 20 minutes without severe pain.  Update 06/06/2016- patient was seen back in October and was given a piriformis injection. Patient was pain free for quite some time. States that she is having worsening symptoms again. Has been sitting a significant amount more and feels like this is contributing to it. Has been trying to do yoga but states that the stretching seems to be very painful. Mild radicular symptoms going down the leg. Patient states that it so severe it is waking her up at night. Unable to find a comfortable position and seen the worse with sitting or laying down and seems to be better with standing or movement.  No past medical history on file. Past Surgical History:  Procedure Laterality Date  . APPENDECTOMY    . HERNIA REPAIR     Social History  Substance Use Topics  . Smoking status: Never Smoker  . Smokeless tobacco: Never Used  . Alcohol use Yes     Comment: OCCASSIONALLY   No Known Allergies Family History  Problem Relation Age of Onset  . Diabetes type II Mother   . Atrial fibrillation Father   . Rheumatic fever Father   . Atrial fibrillation Brother   . Cardiomyopathy Brother      Past medical history, social, surgical and family history all reviewed in electronic medical record.  No pertanent information unless stated regarding to the  chief complaint.   Review of Systems: No headache, visual changes, nausea, vomiting, diarrhea, constipation, dizziness, abdominal pain, skin rash, fevers, chills, night sweats, weight loss, swollen lymph nodes, body aches, joint swelling, muscle aches, chest pain, shortness of breath, mood changes.   Objective  Blood pressure 130/80, pulse 88, height 5' 5.75" (1.67 m), weight 134 lb (60.8 kg), SpO2 96 %.   Systems examined below as of 06/06/16 General: NAD A&O x3 mood, affect normal  HEENT: Pupils equal,  extraocular movements intact no nystagmus Respiratory: not short of breath at rest or with speaking Cardiovascular: No lower extremity edema, non tender Skin: Warm dry intact with no signs of infection or rash on extremities or on axial skeleton. Abdomen: Soft nontender, no masses Neuro: Cranial nerves  intact, neurovascularly intact in all extremities with 2+ DTRs and 2+ pulses. Lymph: No lymphadenopathy appreciated today  Gait normal with good balance and coordination.  MSK: Non tender with full range of motion and good stability and symmetric strength and tone of shoulders, elbows, wrist,  knee hips and ankles bilaterally.     Back Exam:  Inspection: Unremarkable  Motion: Flexion 30 deg, Extension 25 deg, Side Bending to 45 deg bilaterally,  Rotation to 45 deg bilaterally  SLR laying: Negative  XSLR laying: Negative  Palpable tenderness: Tender in the left piriformis area.Marland Kitchen FABER: Positive left. Sensory change: Gross sensation intact to all lumbar and sacral dermatomes.  Reflexes: 2+ at both patellar tendons, 2+ at achilles tendons, Babinski's downgoing.  Strength at foot  Plantar-flexion: 5/5 Dorsi-flexion: 5/5 Eversion: 5/5 Inversion: 5/5  Leg strength  Quad: 5/5 Hamstring: 5/5 Hip flexor: 5/5 Hip abductors: 5/5  Gait unremarkable.  After verbal consent patient was prepped with call swabs and with a 21-gauge 2 inch needle patient was injected with 2 cc of 0.5% marcaine and 1 cc kenalog 40mg /dL in the left piriformis area. Post injection instructions given.

## 2016-06-06 ENCOUNTER — Encounter: Payer: Self-pay | Admitting: Family Medicine

## 2016-06-06 ENCOUNTER — Ambulatory Visit (INDEPENDENT_AMBULATORY_CARE_PROVIDER_SITE_OTHER): Payer: BLUE CROSS/BLUE SHIELD | Admitting: Family Medicine

## 2016-06-06 DIAGNOSIS — G5702 Lesion of sciatic nerve, left lower limb: Secondary | ICD-10-CM

## 2016-06-06 MED ORDER — PREDNISONE 50 MG PO TABS: 50.0000 mg | ORAL_TABLET | Freq: Every day | ORAL | 0 refills | Status: DC

## 2016-06-06 NOTE — Assessment & Plan Note (Signed)
Patient responded well to the injection. We discussed icing regimen and home exercises. Meloxicam for breakthrough pain. Discussed prednisone if any worsening symptoms. Nortriptyline encouraged to take at night. Follow-up again in 2-3 weeks. Worsening symptoms consider looking at back

## 2016-06-06 NOTE — Patient Instructions (Signed)
Good to see you  Kiara Lewis is your friend.  Ice 20 minutes 2 times daily. Usually after activity and before bed. Exercises 3 times a week.  I injected the muscle again and we will see how it goes.  If not better in 2 days start the prednisone again daily for 5 days.  Nortriptyline at night if you are having trouble.  I am sorry for everything that is going on.  See me again in next 2 weeks.

## 2016-06-18 NOTE — Progress Notes (Signed)
Tawana ScaleZach Jatavious Peppard D.O. [facility] [address] [place], [place] [address] Phone: [phone] Subjective:    CC: Back, hip and leg pain f/u   BJY:[id number]:Subjective   Previous info from previous visit.  Kiara Lewis is a 61 y.o. female coming in with complaint of back, hip and leg pain. Seems to be left-sided. Patient states this all started when she was pushing furniture. Left leg felt a snap in the back of the leg. Having difficult he with daily activities including working as well as driving. Seems to radiate down the posterior aspect the leg to the knee. Mild numbness but denies any weakness. Patient states that she did have some mild response to the steroid as well as a muscle relaxer. Patient went to another specialist. Was diagnosed with more of a piriformis syndrome and has been doing stretching regularly with no significant improvement. States that she continues to have a pain that seems to radiate down the posterior aspect the leg down to her knee. Rates the severity of pain a 6 out of 10. Given her from playing tennis. Is able to walk. States though that she can wake up in the middle of the night with spasming. No significant back pain.   [facility] Patient did have x-rays of her left hip done. These were independently visualized by me. Patient did not have any significant osteoarthritic changes but was found to have calcified fibroid.  02/29/2016 update: Patient was to do home exercises, was with formal physical therapy. Was started on gabapentin. Patient was also started on nitroglycerin. Stated that she has had headaches with the nitroglycerin but not to any other significant side effects. Gabapentin did make her feel funny so discontinued after 2 days. Patient states that she continues to have the same discomfort. Seems to be more in the buttocks region radiating down the leg. Patient states that there is no weakness but she is missing on things in life including  seen her mother turned [id number]. Patient states that it is also affecting her work. Unable to sit for longer than 20 minutes without severe pain.  Update 06/06/2016- patient was seen back in October and was given a piriformis injection. Patient was pain free for quite some time. States that she is having worsening symptoms again. Has been sitting a significant amount more and feels like this is contributing to it. Has been trying to do yoga but states that the stretching seems to be very painful. Mild radicular symptoms going down the leg. Patient states that it so severe it is waking her up at night. Unable to find a comfortable position and seen the worse with sitting or laying down and seems to be better with standing or movement.  UpDate 06/20/2016-patient was previously seen and was given an injection in the piriformis again on 06/06/2016. Patient tolerated the procedure well and was to start increasing activity. Patient was having difficulty with stress as well. Patient states no significant improvement. Patient still has some pain. Seems to be mainly be more localized. Patient states that he concerning his possibly even worsening. Not having any significant improvement with the conservative therapy that did help previously.  No past medical history on file. Past Surgical History:  Procedure Laterality Date  . APPENDECTOMY    . HERNIA REPAIR     Social History  Substance Use Topics  . Smoking status: Never Smoker  . Smokeless tobacco: Never Used  . Alcohol use Yes  Comment: OCCASSIONALLY   No Known Allergies Family History  Problem Relation Age of Onset  . Diabetes type II Mother   . Atrial fibrillation Father   . Rheumatic fever Father   . Atrial fibrillation Brother   . Cardiomyopathy Brother      Past medical history, social, surgical and family history all reviewed in electronic medical record.  No pertanent information unless stated regarding to the chief complaint.   Review  of Systems: No headache, visual changes, nausea, vomiting, diarrhea, constipation, dizziness, abdominal pain, skin rash, fevers, chills, night sweats, weight loss, swollen lymph nodes, body aches, joint swelling, muscle aches, chest pain, shortness of breath, mood changes.  .   Objective  Blood pressure 124/84, pulse 62, height 5' 5.75" (1.67 m).   Review of Systems: No headache, visual changes, nausea, vomiting, diarrhea, constipation, dizziness, abdominal pain, skin rash, fevers, chills, night sweats, weight loss, swollen lymph nodes, body aches, joint swelling, muscle aches, chest pain, shortness of breath, mood changes.     Back Exam:  Inspection: Unremarkable  Motion: Flexion 35 deg, Extension 25 deg, Side Bending to 45 deg bilaterally,  Rotation to 45 deg bilaterally  SLR laying: Negative  XSLR laying: Negative  Palpable tenderness: Less tenderness over the piriformis syndrome. Patient does have what appears to be small abscess versus formation just to the left the midline in the left buttocks. Tender to palpation. FABER: Positive left. Still present Sensory change: Gross sensation intact to all lumbar and sacral dermatomes.  Reflexes: 2+ at both patellar tendons, 2+ at achilles tendons, Babinski's downgoing.  Strength at foot  Plantar-flexion: 5/5 Dorsi-flexion: 5/5 Eversion: 5/5 Inversion: 5/5  Leg strength  Quad: 5/5 Hamstring: 5/5 Hip flexor: 5/5 Hip abductors: 5/5  Gait unremarkable.  Limited muscular skeletal ultrasound was performed and interpreted by Judi Saa  Limited ultrasound of the cystic structure shows that this is fluid-filled. Noncompressible. Increasing Doppler flow surrounding the area. Impression: Likely abscess formation burst possible mass.

## 2016-06-20 ENCOUNTER — Ambulatory Visit: Payer: Self-pay

## 2016-06-20 ENCOUNTER — Encounter: Payer: Self-pay | Admitting: Family Medicine

## 2016-06-20 ENCOUNTER — Ambulatory Visit: Payer: BLUE CROSS/BLUE SHIELD | Admitting: Family Medicine

## 2016-06-20 ENCOUNTER — Ambulatory Visit (INDEPENDENT_AMBULATORY_CARE_PROVIDER_SITE_OTHER): Payer: BLUE CROSS/BLUE SHIELD | Admitting: Family Medicine

## 2016-06-20 VITALS — BP 124/84 | HR 62 | Ht 65.75 in

## 2016-06-20 DIAGNOSIS — S30810A Abrasion of lower back and pelvis, initial encounter: Secondary | ICD-10-CM | POA: Diagnosis not present

## 2016-06-20 DIAGNOSIS — G5702 Lesion of sciatic nerve, left lower limb: Secondary | ICD-10-CM

## 2016-06-20 DIAGNOSIS — L089 Local infection of the skin and subcutaneous tissue, unspecified: Secondary | ICD-10-CM | POA: Diagnosis not present

## 2016-06-20 MED ORDER — DOXYCYCLINE HYCLATE 100 MG PO TABS: 100.0000 mg | ORAL_TABLET | Freq: Two times a day (BID) | ORAL | 0 refills | Status: AC

## 2016-06-20 MED ORDER — VITAMIN D (ERGOCALCIFEROL) 1.25 MG (50000 UNIT) PO CAPS: 50000.0000 [IU] | ORAL_CAPSULE | ORAL | 0 refills | Status: DC

## 2016-06-20 NOTE — Assessment & Plan Note (Signed)
Possible infection versus possible mass. Difficult to associate on the ultrasound at this time. This is much more localized in somewhat different and is likely why patient did not respond to the injection previously. Patient is going to try to increase activity slowly. We discussed icing regimen. Discussed which activities to do a which was to avoid. Patient given doxycycline. Warned of potential side effects. No improvement we will ultrasound again in follow-up in 2-3 weeks. At that time we'll discuss possible incision ORIF increasing Doppler flow in May need to consider MRI.

## 2016-06-20 NOTE — Patient Instructions (Addendum)
Good to see you I think there may be a little abscess.  We will do doxycycline for next 14 days.  Ice is your friend.  Once weekly vitamin D New exercises for upper back  See me again in about 2 weeks.

## 2016-06-29 NOTE — Progress Notes (Signed)
[name] [address] [place], [place] [address] Phone: [phone] Subjective:    CC: Back, hip and leg pain f/u   [id number]   Previous info from previous visit.  GENEVRA ORNE is a 61 y.o. female coming in with complaint of back, hip and leg pain. Seems to be left-sided. Patient states this all started when she was pushing furniture. Left leg felt a snap in the back of the leg. Having difficult he with daily activities including working as well as driving. Seems to radiate down the posterior aspect the leg to the knee. Mild numbness but denies any weakness. Patient states that she did have some mild response to the steroid as well as a muscle relaxer. Patient went to another specialist.    patient was seen by [facility] . Patient did have x-rays of her left hip done. These were independently visualized by me. Patient did not have any significant osteoarthritic changes but was found to have calcified fibroid.  02/29/2016 update: Patient was to do home exercises, was with formal physical therapy. Was started on gabapentin. Patient was also started on nitroglycerin. Stated that she has had headaches with the nitroglycerin but not to any other significant side effects. Gabapentin did make her feel funny so discontinued after 2 days. Patient states that she continues to have the same discomfort. Seems to be more in the buttocks region radiating down the leg. Patient states that there is no weakness but she is missing on things in life including seen her mother turned 97. Patient states that it is also affecting her work. Unable to sit for longer than 20 minutes without severe pain.  Update 06/06/2016- patient was seen back in October and was given a piriformis injection. Patient was pain free for quite some time. States that she is having worsening symptoms again. Has been sitting a significant amount more and feels like this is contributing to it. Has been trying  to do yoga but states that the stretching seems to be very painful. Mild radicular symptoms going down the leg. Patient states that it so severe it is waking her up at night. Unable to find a comfortable position and seen the worse with sitting or laying down and seems to be better with standing or movement.  UpDate 06/20/2016-patient was previously seen and was given an injection in the piriformis again on 06/06/2016. Patient tolerated the procedure well and was to start increasing activity. Patient was having difficulty with stress as well. Patient states no significant improvement. Patient still has some pain. Seems to be mainly be more localized. Patient states that he concerning his possibly even worsening. Not having any significant improvement with the conservative therapy that did help previously.  06/30/16 update.  Seen last week and found to have possible mass vs abscess in area. Given Abx. Told to avoid certain activities.  Here for follow up.  Patient states significant improvement of the left side. Having more pain on the right side. States that there is still more of a tightness. No radiation down the leg. Patient is finally starting to feel like she is making progress though. Patient is really wanting to different tendon tenderness but is concerned and she will re-aggravate the area.  No past medical history on file. Past Surgical History:  Procedure Laterality Date  . APPENDECTOMY    . HERNIA REPAIR     Social History  Substance Use Topics  . Smoking status: Never Smoker  . Smokeless  tobacco: Never Used  . Alcohol use Yes     Comment: OCCASSIONALLY   No Known Allergies Family History  Problem Relation Age of Onset  . Diabetes type II Mother   . Atrial fibrillation Father   . Rheumatic fever Father   . Atrial fibrillation Brother   . Cardiomyopathy Brother      Past medical history, social, surgical and family history all reviewed in electronic medical record.  No  pertanent information unless stated regarding to the chief complaint.   Review of Systems: No headache, visual changes, nausea, vomiting, diarrhea, constipation, dizziness, abdominal pain, skin rash, fevers, chills, night sweats, weight loss, swollen lymph nodes, body aches, joint swelling, chest pain, shortness of breath, mood changes. + muscle aches.  .  . Objective  Blood pressure 128/80, pulse 80, height 5\' 6"  (1.676 m), weight 130 lb (59 kg).   Systems examined below as of 06/30/16 General: NAD A&O x3 mood, affect normal  HEENT: Pupils equal, extraocular movements intact no nystagmus Respiratory: not short of breath at rest or with speaking Cardiovascular: No lower extremity edema, non tender Skin: Warm dry intact with no signs of infection or rash on extremities or on axial skeleton. Abdomen: Soft nontender, no masses Neuro: Cranial nerves  intact, neurovascularly intact in all extremities with 2+ DTRs and 2+ pulses. Lymph: No lymphadenopathy appreciated today  Gait normal with good balance and coordination.  MSK: Non tender with full range of motion and good stability and symmetric strength and tone of shoulders, elbows, wrist,  knee hips and ankles bilaterally.      Back Exam:  Inspection: Unremarkable  Motion: Flexion 35 deg, Extension 25 deg, Side Bending to 45 deg bilaterally,  Rotation to 45 deg bilaterally  SLR laying: Negative  XSLR laying: Negative  Palpable tenderness:Bump palpated last time no longer there. No abscess felt. No skin changes. FABER: Negative bilateral Sensory change: Gross sensation intact to all lumbar and sacral dermatomes.  Reflexes: 2+ at both patellar tendons, 2+ at achilles tendons, Babinski's downgoing.  Strength at foot  Plantar-flexion: 5/5 Dorsi-flexion: 5/5 Eversion: 5/5 Inversion: 5/5  Leg strength  Quad: 5/5 Hamstring: 5/5 Hip flexor: 5/5 Hip abductors: 5/5  Gait unremarkable.  Limited muscular skeletal ultrasound was performed and  interpreted by Judi SaaZachary M Kaetlyn Noa  Fluid-filled abscess that was noted previously in the left buttocks region is no longer there. Patient is no longer tender to palpation in the area either. Impression: Resolved abscess  Osteopathic findings  T3 extended rotated and side bent right inhaled third rib T9 extended rotated and side bent left L2 flexed rotated and side bent right Sacrum right on right Pain over pubic symphysis.

## 2016-06-30 ENCOUNTER — Ambulatory Visit (INDEPENDENT_AMBULATORY_CARE_PROVIDER_SITE_OTHER): Payer: BLUE CROSS/BLUE SHIELD | Admitting: Family Medicine

## 2016-06-30 ENCOUNTER — Encounter: Payer: Self-pay | Admitting: Family Medicine

## 2016-06-30 ENCOUNTER — Ambulatory Visit: Payer: Self-pay

## 2016-06-30 VITALS — BP 128/80 | HR 80 | Ht 66.0 in | Wt 130.0 lb

## 2016-06-30 DIAGNOSIS — G5702 Lesion of sciatic nerve, left lower limb: Secondary | ICD-10-CM

## 2016-06-30 DIAGNOSIS — L089 Local infection of the skin and subcutaneous tissue, unspecified: Secondary | ICD-10-CM

## 2016-06-30 DIAGNOSIS — M25551 Pain in right hip: Secondary | ICD-10-CM | POA: Diagnosis not present

## 2016-06-30 DIAGNOSIS — S30810D Abrasion of lower back and pelvis, subsequent encounter: Secondary | ICD-10-CM

## 2016-06-30 DIAGNOSIS — M999 Biomechanical lesion, unspecified: Secondary | ICD-10-CM | POA: Diagnosis not present

## 2016-06-30 MED ORDER — PREDNISONE 50 MG PO TABS: ORAL_TABLET | ORAL | 0 refills | Status: DC

## 2016-06-30 NOTE — Addendum Note (Signed)
Addended by: Edwena FeltyARSON, LINDSAY T on: 06/30/2016 10:12 AM   Modules accepted: Orders

## 2016-06-30 NOTE — Patient Instructions (Signed)
Good to see you  The abscess is gone.  Ice is your friend still.  I think the exercises 3 times a week Yoga try not to go too deep  Tennis 1-2 times a week.  See me again in 3 weeks.

## 2016-06-30 NOTE — Assessment & Plan Note (Signed)
Decision today to treat with OMT was based on Physical Exam  After verbal consent patient was treated with HVLA, ME, FPR techniques in cervical, thoracic, lumbar and sacral areas  Patient tolerated the procedure well with improvement in symptoms  Patient given exercises, stretches and lifestyle modifications  See medications in patient instructions if given  Patient will follow up in 3 weeks 

## 2016-06-30 NOTE — Assessment & Plan Note (Signed)
Piriformis Syndrome  Using an anatomical model, reviewed with the patient the structures involved and how they related to diagnosis. The patient indicated understanding.   The patient was given a handout from Dr. Ailene Ardsouzier's book "The Sports Medicine Patient Advisor" describing the anatomy and rehabilitation of the following condition: Piriformis Syndrome  Also given a handout with more extensive Piriformis stretching, hip flexor and abductor strengthening, ham stretching  Rec deep massage, explained self-massage with ball RTRC in 4 weeks.

## 2016-06-30 NOTE — Assessment & Plan Note (Signed)
I believe the patient did have multiple different things occurring. Patient did have a piriformis syndrome on the left side but as well as an infected abrasion on the left buttocks. Patient did respond very well to the oral antibiotics and seems to be doing better at this time. Patient still though does have unfortunately muscle weakness. I do believe also the tear in the left hamstring is likely secondary to the injury from either the furniture pushing or the potential fall also can chair beaded to this. We are finally making some

## 2016-07-20 NOTE — Progress Notes (Signed)
[name] D[facility]  [facility] [address] [place], [place] [address] Phone: [phone]) [phone] Subjective:    CC: Back, hip and leg pain f/u   [id number]:Subjective   Previous info from previous visit.  Kiara Lewis is a 61 y.o. female coming in with complaint of back, hip and leg pain. Seems to be left-sided. Patient states this all started when she was pushing furniture. Left leg felt a snap in the back of the leg. Having difficult he with daily activities including working as well as driving. Seems to radiate down the posterior aspect the leg to the knee. Mild numbness but denies any weakness. Patient states that she did have some mild response to the steroid as well as a muscle relaxer. Patient went to another specialist.    [facility] Patient did have x-rays of her left hip done. These were independently visualized by me. Patient did not have any significant osteoarthritic changes but was found to have calcified fibroid.  02/29/2016 update: Patient was to do home exercises, was with formal physical therapy. Was started on gabapentin. Patient was also started on nitroglycerin. Stated that she has had headaches with the nitroglycerin but not to any other significant side effects. Gabapentin did make her feel funny so discontinued after 2 days. Patient states that she continues to have the same discomfort. Seems to be more in the buttocks region radiating down the leg. Patient states that there is no weakness but she is missing on things in life including seen her mother turned [id number]. Patient states that it is also affecting her work. Unable to sit for longer than 20 minutes without severe pain.  Update 06/06/2016- patient was seen back in October and was given a piriformis injection. Patient was pain free for quite some time. States that she is having worsening symptoms again. Has been sitting a significant amount more and feels like this is contributing to it. Has been trying  to do yoga but states that the stretching seems to be very painful. Mild radicular symptoms going down the leg. Patient states that it so severe it is waking her up at night. Unable to find a comfortable position and seen the worse with sitting or laying down and seems to be better with standing or movement.  UpDate 06/20/2016-patient was previously seen and was given an injection in the piriformis again on 06/06/2016. Patient tolerated the procedure well and was to start increasing activity. Patient was having difficulty with stress as well. Patient states no significant improvement. Patient still has some pain. Seems to be mainly be more localized. Patient states that he concerning his possibly even worsening. Not having any significant improvement with the conservative therapy that did help previously.  06/30/16 update.  Seen last week and found to have possible mass vs abscess in area. Given Abx. Told to avoid certain activities.  Here for follow up.  Patient states significant improvement of the left side. Having more pain on the right side. States that there is still more of a tightness. No radiation down the leg. Patient is finally starting to feel like she is making progress though. Patient is really wanting to different tendon tenderness but is concerned and she will re-aggravate the area.  07/21/2016 update. Patient was seen previously and we did start osteopathic manipulation. Patient didn't respond fairly well. Patient was to start increasing activity as well as core strengthening exercises on a more regular basis. Patient states overall seems to be doing  relatively well states that there is still some discomfort. States that the manipulation did help for approximately 5 days. Patient states now seems to be back to her baseline. Has started playing tennis and doing other things.  No past medical history on file. Past Surgical History:  Procedure Laterality Date  . APPENDECTOMY    . HERNIA  REPAIR     Social History  Substance Use Topics  . Smoking status: Never Smoker  . Smokeless tobacco: Never Used  . Alcohol use Yes     Comment: OCCASSIONALLY   No Known Allergies Family History  Problem Relation Age of Onset  . Diabetes type II Mother   . Atrial fibrillation Father   . Rheumatic fever Father   . Atrial fibrillation Brother   . Cardiomyopathy Brother      Past medical history, social, surgical and family history all reviewed in electronic medical record.  No pertanent information unless stated regarding to the chief complaint.   Review of Systems: No headache, visual changes, nausea, vomiting, diarrhea, constipation, dizziness, abdominal pain, skin rash, fevers, chills, night sweats, weight loss, swollen lymph nodes, body aches, joint swelling, chest pain, shortness of breath, mood changes. + muscle aches.  .  . Objective  Blood pressure 110/74, pulse 78, height 5\' 6"  (1.676 m), weight 133 lb (60.3 kg).   Systems examined below as of 07/21/16 General: NAD A&O x3 mood, affect normal  HEENT: Pupils equal, extraocular movements intact no nystagmus Respiratory: not short of breath at rest or with speaking Cardiovascular: No lower extremity edema, non tender Skin: Warm dry intact with no signs of infection or rash on extremities or on axial skeleton. Abdomen: Soft nontender, no masses Neuro: Cranial nerves  intact, neurovascularly intact in all extremities with 2+ DTRs and 2+ pulses. Lymph: No lymphadenopathy appreciated today  Gait normal with good balance and coordination.  MSK: Non tender with full range of motion and good stability and symmetric strength and tone of shoulders, elbows, wrist,  knee hips and ankles bilaterally.     Back Exam:  Inspection: Unremarkable  Motion: Flexion 35 deg, Extension 25 deg, Side Bending to 35 deg bilaterally,  Rotation to 30 deg bilaterally  SLR laying: Negative  XSLR laying: Negative  Palpable tenderness: Continued  to have some mild discomfort in the per spinal musculature of the lumbar spine but more pain over the right piriformis syndrome. FABER: Mild discomfort on the right side and tightness of the hamstrings bilaterally Sensory change: Gross sensation intact to all lumbar and sacral dermatomes.  Reflexes: 2+ at both patellar tendons, 2+ at achilles tendons, Babinski's downgoing.  Strength at foot  Plantar-flexion: 5/5 Dorsi-flexion: 5/5 Eversion: 5/5 Inversion: 5/5  Leg strength  Quad: 5/5 Hamstring: 5/5 Hip flexor: 5/5 Hip abductors: 5/5  Gait unremarkable.  Osteopathic findings C2 flexed rotated and side bent right T3 extended rotated and side bent right inhaled third rib T7 extended rotated and side bent left L2 flexed rotated and side bent right Sacrum right on right

## 2016-07-21 ENCOUNTER — Encounter: Payer: Self-pay | Admitting: Family Medicine

## 2016-07-21 ENCOUNTER — Ambulatory Visit: Payer: Self-pay

## 2016-07-21 ENCOUNTER — Ambulatory Visit (INDEPENDENT_AMBULATORY_CARE_PROVIDER_SITE_OTHER): Payer: BLUE CROSS/BLUE SHIELD | Admitting: Family Medicine

## 2016-07-21 VITALS — BP 110/74 | HR 78 | Ht 66.0 in | Wt 133.0 lb

## 2016-07-21 DIAGNOSIS — M999 Biomechanical lesion, unspecified: Secondary | ICD-10-CM

## 2016-07-21 DIAGNOSIS — G5702 Lesion of sciatic nerve, left lower limb: Secondary | ICD-10-CM

## 2016-07-21 DIAGNOSIS — M79606 Pain in leg, unspecified: Secondary | ICD-10-CM | POA: Diagnosis not present

## 2016-07-21 NOTE — Assessment & Plan Note (Signed)
Still having significant tightness. Did have a tear of the left hamstring. If worsening symptoms I do feel that advance imaging will be warranted. We'll continue to attempt to do the home exercises. We discussed icing regimen and compression sleeve. Patient will come back and see me again in 3 weeks for further evaluation.

## 2016-07-21 NOTE — Assessment & Plan Note (Signed)
Decision today to treat with OMT was based on Physical Exam  After verbal consent patient was treated with HVLA, ME, FPR techniques in  thoracic, lumbar and sacral areas  Patient tolerated the procedure well with improvement in symptoms  Patient given exercises, stretches and lifestyle modifications  See medications in patient instructions if given  Patient will follow up in 3-4 weeks 

## 2016-07-21 NOTE — Patient Instructions (Signed)
Good to see you  Ice is still your friend.  I think we are doing well.  Lets continue with the manipulation.  With yoga use your block and listen to your body, careful with stretches at the end See me again in 3 weeks.

## 2016-08-09 DIAGNOSIS — F419 Anxiety disorder, unspecified: Secondary | ICD-10-CM | POA: Diagnosis not present

## 2016-08-09 DIAGNOSIS — R6882 Decreased libido: Secondary | ICD-10-CM | POA: Diagnosis not present

## 2016-08-09 DIAGNOSIS — E559 Vitamin D deficiency, unspecified: Secondary | ICD-10-CM | POA: Diagnosis not present

## 2016-08-09 DIAGNOSIS — E039 Hypothyroidism, unspecified: Secondary | ICD-10-CM | POA: Diagnosis not present

## 2016-08-09 DIAGNOSIS — N951 Menopausal and female climacteric states: Secondary | ICD-10-CM | POA: Diagnosis not present

## 2016-08-10 NOTE — Progress Notes (Signed)
[name] [address] [place], [place] [address] Phone: [phone] Subjective:    CC: Back, hip and leg pain f/u   [id number]   Previous info from previous visit.  Kiara Lewis is a 61 y.o. female coming in with complaint of back, hip and leg pain. Seems to be left-sided. Patient states this all started when she was pushing furniture. Left leg felt a snap in the back of the leg. Having difficult he with daily activities including working as well as driving. Seems to radiate down the posterior aspect the leg to the knee. Mild numbness but denies any weakness. Patient states that she did have some mild response to the steroid as well as a muscle relaxer. Patient went to another specialist.    [facility] . Patient did have x-rays of her left hip done. These were independently visualized by me. Patient did not have any significant osteoarthritic changes but was found to have calcified fibroid.  02/29/2016 update: Patient was to do home exercises, was with formal physical therapy. Was started on gabapentin. Patient was also started on nitroglycerin. Stated that she has had headaches with the nitroglycerin but not to any other significant side effects. Gabapentin did make her feel funny so discontinued after 2 days. Patient states that she continues to have the same discomfort. Seems to be more in the buttocks region radiating down the leg. Patient states that there is no weakness but she is missing on things in life including seen her mother turned 70. Patient states that it is also affecting her work. Unable to sit for longer than 20 minutes without severe pain.  Update 06/06/2016- patient was seen back in October and was given a piriformis injection. Patient was pain free for quite some time. States that she is having worsening symptoms again. Has been sitting a significant amount more and feels like this is contributing to it. Has been trying  to do yoga but states that the stretching seems to be very painful. Mild radicular symptoms going down the leg. Patient states that it so severe it is waking her up at night. Unable to find a comfortable position and seen the worse with sitting or laying down and seems to be better with standing or movement.  UpDate 06/20/2016-patient was previously seen and was given an injection in the piriformis again on 06/06/2016. Patient tolerated the procedure well and was to start increasing activity. Patient was having difficulty with stress as well. Patient states no significant improvement. Patient still has some pain. Seems to be mainly be more localized. Patient states that he concerning his possibly even worsening. Not having any significant improvement with the conservative therapy that did help previously.  06/30/16 update.  Seen last week and found to have possible mass vs abscess in area. Given Abx. Told to avoid certain activities.  Here for follow up.  Patient states significant improvement of the left side. Having more pain on the right side. States that there is still more of a tightness. No radiation down the leg. Patient is finally starting to feel like she is making progress though. Patient is really wanting to different tendon tenderness but is concerned and she will re-aggravate the area.  07/21/2016 update. Patient was seen previously and we did start osteopathic manipulation. Patient didn't respond fairly well. Patient was to start increasing activity as well as core strengthening exercises on a more regular basis. Patient states overall seems to be doing  relatively well states that there is still some discomfort. States that the manipulation did help for approximately 5 days. Patient states now seems to be back to her baseline. Has started playing tennis and doing other things.  No past medical history on file. Past Surgical History:  Procedure Laterality Date  . APPENDECTOMY    . HERNIA  REPAIR     Social History  Substance Use Topics  . Smoking status: Never Smoker  . Smokeless tobacco: Never Used  . Alcohol use Yes     Comment: OCCASSIONALLY   No Known Allergies Family History  Problem Relation Age of Onset  . Diabetes type II Mother   . Atrial fibrillation Father   . Rheumatic fever Father   . Atrial fibrillation Brother   . Cardiomyopathy Brother      Past medical history, social, surgical and family history all reviewed in electronic medical record.  No pertanent information unless stated regarding to the chief complaint.   Review of Systems: No headache, visual changes, nausea, vomiting, diarrhea, constipation, dizziness, abdominal pain, skin rash, fevers, chills, night sweats, weight loss, swollen lymph nodes, body aches, joint swelling,  chest pain, shortness of breath, Still positive muscle aches and some mild mood changes with increasing anxiety . Objective  Blood pressure 124/82, pulse 60, resp. rate 16, weight 134 lb (60.8 kg), SpO2 92 %.   Systems examined below as of 08/11/16 General: NAD A&O x3 mood, affect normal  HEENT: Pupils equal, extraocular movements intact no nystagmus Respiratory: not short of breath at rest or with speaking Cardiovascular: No lower extremity edema, non tender Skin: Warm dry intact with no signs of infection or rash on extremities or on axial skeleton. Abdomen: Soft nontender, no masses Neuro: Cranial nerves  intact, neurovascularly intact in all extremities with 2+ DTRs and 2+ pulses. Lymph: No lymphadenopathy appreciated today  Gait normal with good balance and coordination.  MSK: Non tender with full range of motion and good stability and symmetric strength and tone of shoulders, elbows, wrist,  knee hips and ankles bilaterally.    Back Exam:  Inspection: Unremarkable  Motion: Flexion 35 deg, Extension 25 deg,Worsening symptoms. Side Bending to 35 deg bilaterally,  Rotation to 30 deg bilaterally  SLR laying:  Negative  XSLR laying: Negative  Palpable tenderness: Still some tenderness noted in the buttocks region right greater left. FABER: Mild increasing tightness noted. Sensory change: Gross sensation intact to all lumbar and sacral dermatomes.  Reflexes: 2+ at both patellar tendons, 2+ at achilles tendons, Babinski's downgoing.  Strength at foot  Plantar-flexion: 5/5 Dorsi-flexion: 5/5 Eversion: 5/5 Inversion: 5/5  Leg strength  Quad: 5/5 Hamstring: 5/5 Hip flexor: 5/5 Hip abductors: 5/5  Gait unremarkable.  Osteopathic findings Cervical C2 flexed rotated and side bent right C4 flexed rotated and side bent left C6 flexed rotated and side bent left T3 extended rotated and side bent right inhaled third rib T9 extended rotated and side bent left L2 flexed rotated and side bent right Sacrum right on right

## 2016-08-11 ENCOUNTER — Encounter: Payer: Self-pay | Admitting: Family Medicine

## 2016-08-11 ENCOUNTER — Ambulatory Visit (INDEPENDENT_AMBULATORY_CARE_PROVIDER_SITE_OTHER): Payer: BLUE CROSS/BLUE SHIELD | Admitting: Family Medicine

## 2016-08-11 ENCOUNTER — Ambulatory Visit: Payer: Self-pay

## 2016-08-11 VITALS — BP 124/82 | HR 60 | Resp 16 | Wt 134.0 lb

## 2016-08-11 DIAGNOSIS — M791 Myalgia: Secondary | ICD-10-CM

## 2016-08-11 DIAGNOSIS — M7918 Myalgia, other site: Secondary | ICD-10-CM

## 2016-08-11 DIAGNOSIS — S76312D Strain of muscle, fascia and tendon of the posterior muscle group at thigh level, left thigh, subsequent encounter: Secondary | ICD-10-CM | POA: Diagnosis not present

## 2016-08-11 DIAGNOSIS — M999 Biomechanical lesion, unspecified: Secondary | ICD-10-CM

## 2016-08-11 MED ORDER — VENLAFAXINE HCL ER 37.5 MG PO CP24: 37.5000 mg | ORAL_CAPSULE | Freq: Every day | ORAL | 1 refills | Status: DC

## 2016-08-11 NOTE — Assessment & Plan Note (Signed)
Decision today to treat with OMT was based on Physical Exam  After verbal consent patient was treated with HVLA, ME, FPR techniques in cervical, thoracic, lumbar and sacral areas  Patient tolerated the procedure well with improvement in symptoms  Patient given exercises, stretches and lifestyle modifications  See medications in patient instructions if given  Patient will follow up in 3-4 weeks  

## 2016-08-11 NOTE — Patient Instructions (Addendum)
Good to see you  Sorry for any confusion today  Ice is your friend.  Effexor 37.5 mg daily  Keep doing the exercises.  Send me a message in 1 week and if not better we will consider MRI  Otherwise see me again in 3-4 weeks Also watch the posture with sitting a little.

## 2016-08-11 NOTE — Progress Notes (Signed)
Pre-visit discussion using our clinic review tool. No additional management support is needed unless otherwise documented below in the visit note.  

## 2016-08-11 NOTE — Assessment & Plan Note (Signed)
I believe that this tear is completely resolved at this time. I do believe that there is some nerve root impingement and likely contributing from the lumbar spine. Patient has declined any type imaging at this time. The pain is been greater than 3 months and has failed all other conservative therapy. We will start Effexor. We will see how patient response to this. We discussed icing regimen. Follow-up again in 4 weeks for further evaluation and treatment.

## 2016-08-17 DIAGNOSIS — J018 Other acute sinusitis: Secondary | ICD-10-CM | POA: Diagnosis not present

## 2016-08-17 DIAGNOSIS — Z6821 Body mass index (BMI) 21.0-21.9, adult: Secondary | ICD-10-CM | POA: Diagnosis not present

## 2016-08-23 DIAGNOSIS — N951 Menopausal and female climacteric states: Secondary | ICD-10-CM | POA: Diagnosis not present

## 2016-08-23 DIAGNOSIS — F419 Anxiety disorder, unspecified: Secondary | ICD-10-CM | POA: Diagnosis not present

## 2016-08-23 DIAGNOSIS — E039 Hypothyroidism, unspecified: Secondary | ICD-10-CM | POA: Diagnosis not present

## 2016-08-25 ENCOUNTER — Telehealth: Payer: Self-pay | Admitting: Family Medicine

## 2016-08-25 ENCOUNTER — Encounter: Payer: Self-pay | Admitting: Family Medicine

## 2016-08-25 NOTE — Telephone Encounter (Signed)
Spoke with patient. X-ray impression from 2017 is in patient's chart under care everywhere. She wanted to make Dr. Katrinka Blazing aware before [facility] appointment.

## 2016-08-25 NOTE — Telephone Encounter (Signed)
Mrs. Lacson states that Dr. Wilburn Mylar office is know on the epic system and that Dr. Katrinka Blazing had been requesting x-ray's from 2017 from patient.  Patient would like to know if there is a way Dr. Katrinka Blazing can get those xrays now through epic.  If not patient has been scheduled for Monday the 30th for hurting her back.  Please get patient to complete a records release form if needed otherwise.  Patient is requesting a call back before Monday appt if possible.

## 2016-08-28 NOTE — Progress Notes (Signed)
[name] [address] [place], [place] [address] Phone: [phone] Subjective:    CC: Back, hip and leg pain f/u   [id number]   Previous info from previous visit.  Kiara Lewis is a 61 y.o. female coming in with complaint of back, hip and leg pain. Seems to be left-sided. Patient states this all started when she was pushing furniture. Left leg felt a snap in the back of the leg. Having difficult he with daily activities including working as well as driving. Seems to radiate down the posterior aspect the leg to the knee. Mild numbness but denies any weakness. Patient states that she did have some mild response to the steroid as well as a muscle relaxer. Patient went to another specialist.    patient was seen by [facility] . Patient did have x-rays of her left hip done. These were independently visualized by me. Patient did not have any significant osteoarthritic changes but was found to have calcified fibroid.  02/29/2016 update: Patient was to do home exercises, was with formal physical therapy. Was started on gabapentin. Patient was also started on nitroglycerin. Stated that she has had headaches with the nitroglycerin but not to any other significant side effects. Gabapentin did make her feel funny so discontinued after 2 days. Patient states that she continues to have the same discomfort. Seems to be more in the buttocks region radiating down the leg. Patient states that there is no weakness but she is missing on things in life including seen her mother turned 13. Patient states that it is also affecting her work. Unable to sit for longer than 20 minutes without severe pain.  Update 06/06/2016- patient was seen back in October and was given a piriformis injection. Patient was pain free for quite some time. States that she is having worsening symptoms again. Has been sitting a significant amount more and feels like this is contributing to it. Has been trying  to do yoga but states that the stretching seems to be very painful. Mild radicular symptoms going down the leg. Patient states that it so severe it is waking her up at night. Unable to find a comfortable position and seen the worse with sitting or laying down and seems to be better with standing or movement.  UpDate 06/20/2016-patient was previously seen and was given an injection in the piriformis again on 06/06/2016. Patient tolerated the procedure well and was to start increasing activity. Patient was having difficulty with stress as well. Patient states no significant improvement. Patient still has some pain. Seems to be mainly be more localized. Patient states that he concerning his possibly even worsening. Not having any significant improvement with the conservative therapy that did help previously.  06/30/16 update.  Seen last week and found to have possible mass vs abscess in area. Given Abx. Told to avoid certain activities.  Here for follow up.  Patient states significant improvement of the left side. Having more pain on the right side. States that there is still more of a tightness. No radiation down the leg. Patient is finally starting to feel like she is making progress though. Patient is really wanting to different tendon tenderness but is concerned and she will re-aggravate the area.  07/21/2016 update. Patient was seen previously and we did start osteopathic manipulation. Patient didn't respond fairly well. Patient was to start increasing activity as well as core strengthening exercises on a more regular basis. Patient states overall seems to be doing  relatively well states that there is still some discomfort. States that the manipulation did help for approximately 5 days. Patient states now seems to be back to her baseline. Has started playing tennis and doing other things.  08/29/2016 update-patient states that she is coming in with different pain. Seems to be more on the right side. Seems to  be higher than usual. Was doing a lot of gardening. Feels that exacerbated it. Patient states that it is more of a spasming sensation. Different than her other areas. Rates the severity pain is 7 out of 10. States localized with no radicular symptoms. No pain in the buttocks area in his usual.  History reviewed. No pertinent past medical history. Past Surgical History:  Procedure Laterality Date  . APPENDECTOMY    . HERNIA REPAIR     Social History  Substance Use Topics  . Smoking status: Never Smoker  . Smokeless tobacco: Never Used  . Alcohol use Yes     Comment: OCCASSIONALLY   No Known Allergies Family History  Problem Relation Age of Onset  . Diabetes type II Mother   . Atrial fibrillation Father   . Rheumatic fever Father   . Atrial fibrillation Brother   . Cardiomyopathy Brother      Past medical history, social, surgical and family history all reviewed in electronic medical record.  No pertanent information unless stated regarding to the chief complaint.   Review of Systems: No headache, visual changes, nausea, vomiting, diarrhea, constipation, dizziness, abdominal pain, skin rash, fevers, chills, night sweats, weight loss, swollen lymph nodes, body aches, joint swelling, muscle aches, chest pain, shortness of breath, mood changes.   . Objective  Blood pressure 118/62, pulse 69, resp. rate 16, weight 134 lb 2 oz (60.8 kg), SpO2 98 %.   Systems examined below as of 08/29/16 General: NAD A&O x3 mood, affect normal  HEENT: Pupils equal, extraocular movements intact no nystagmus Respiratory: not short of breath at rest or with speaking Cardiovascular: No lower extremity edema, non tender Skin: Warm dry intact with no signs of infection or rash on extremities or on axial skeleton. Abdomen: Soft nontender, no masses Neuro: Cranial nerves  intact, neurovascularly intact in all extremities with 2+ DTRs and 2+ pulses. Lymph: No lymphadenopathy appreciated today  Gait  normal with good balance and coordination.  MSK: Non tender with full range of motion and good stability and symmetric strength and tone of shoulders, elbows, wrist,  knee hips and ankles bilaterally.    Back Exam:  Inspection: Unremarkable  Motion: Flexion 35 deg, Extension 25 deg,Worsening symptoms. Side Bending to 35 deg bilaterally,  Rotation to 35 deg bilaterally  SLR laying: Negative  XSLR laying: Negative  Palpable tenderness: Significant increased pain in the paraspinal musculature on the right side of the lumbar spine. Significant tightness more of the hip flexor. FABER: Increasing tightness of the right side Sensory change: Gross sensation intact to all lumbar and sacral dermatomes.  Reflexes: 2+ at both patellar tendons, 2+ at achilles tendons, Babinski's downgoing.  Strength at foot  Plantar-flexion: 5/5 Dorsi-flexion: 5/5 Eversion: 5/5 Inversion: 5/5  Leg strength  Quad: 5/5 Hamstring: 5/5 Hip flexor: 5/5 Hip abductors: 5/5  Gait unremarkable.  Osteopathic findings  C6 flexed rotated and side bent right T8 extended rotated and side bent left L2 flexed rotated and side bent right L4 flexed rotated and side bent right Sacrum right on right

## 2016-08-29 ENCOUNTER — Encounter: Payer: Self-pay | Admitting: Family Medicine

## 2016-08-29 ENCOUNTER — Ambulatory Visit (INDEPENDENT_AMBULATORY_CARE_PROVIDER_SITE_OTHER): Payer: BLUE CROSS/BLUE SHIELD | Admitting: Family Medicine

## 2016-08-29 VITALS — BP 118/62 | HR 69 | Resp 16 | Wt 134.1 lb

## 2016-08-29 DIAGNOSIS — M549 Dorsalgia, unspecified: Secondary | ICD-10-CM | POA: Diagnosis not present

## 2016-08-29 DIAGNOSIS — M24559 Contracture, unspecified hip: Secondary | ICD-10-CM | POA: Insufficient documentation

## 2016-08-29 DIAGNOSIS — M24551 Contracture, right hip: Secondary | ICD-10-CM

## 2016-08-29 DIAGNOSIS — M999 Biomechanical lesion, unspecified: Secondary | ICD-10-CM | POA: Diagnosis not present

## 2016-08-29 MED ORDER — METHYLPREDNISOLONE ACETATE 80 MG/ML IJ SUSP
80.0000 mg | Freq: Once | INTRAMUSCULAR | Status: AC
  Administered 2016-08-29: 80 mg via INTRAMUSCULAR

## 2016-08-29 MED ORDER — VITAMIN D (ERGOCALCIFEROL) 1.25 MG (50000 UNIT) PO CAPS: 50000.0000 [IU] | ORAL_CAPSULE | ORAL | 0 refills | Status: DC

## 2016-08-29 MED ORDER — KETOROLAC TROMETHAMINE 60 MG/2ML IM SOLN
60.0000 mg | Freq: Once | INTRAMUSCULAR | Status: AC
  Administered 2016-08-29: 60 mg via INTRAMUSCULAR

## 2016-08-29 MED ORDER — TIZANIDINE HCL 4 MG PO TABS: 4.0000 mg | ORAL_TABLET | Freq: Every evening | ORAL | 2 refills | Status: AC

## 2016-08-29 NOTE — Assessment & Plan Note (Signed)
Decision today to treat with OMT was based on Physical Exam  After verbal consent patient was treated with HVLA, ME, FPR techniques in cervical, thoracic, lumbar and sacral areas  Patient tolerated the procedure well with improvement in symptoms  Patient given exercises, stretches and lifestyle modifications  See medications in patient instructions if given  Patient will follow up in 4-8 weeks 

## 2016-08-29 NOTE — Assessment & Plan Note (Signed)
I believe the patient was having more of a spasm of the hip flexor today. I do not think that there is any radicular symptoms. Likely not secondary to a radicular side things such as a back herniated disc. We discussed icing regimen, patient responded fairly well to manipulation and was also given injections today. We discussed which exits do a which was to avoid. Patient will follow-up with me again in the course of next 4-8 weeks.

## 2016-08-29 NOTE — Patient Instructions (Addendum)
Good to see you Kiara Lewis is your friend. Ice 20 minutes 2 times daily. Usually after activity and before bed. zanaflex at night for next 3 nights then as needed.  Best thing you can do is to stay active I will see you soon!

## 2016-09-06 ENCOUNTER — Other Ambulatory Visit: Payer: Self-pay

## 2016-09-06 ENCOUNTER — Encounter: Payer: Self-pay | Admitting: Family Medicine

## 2016-09-06 ENCOUNTER — Ambulatory Visit (INDEPENDENT_AMBULATORY_CARE_PROVIDER_SITE_OTHER): Payer: BLUE CROSS/BLUE SHIELD | Admitting: Family Medicine

## 2016-09-06 VITALS — BP 126/88 | HR 82 | Resp 16 | Wt 131.0 lb

## 2016-09-06 DIAGNOSIS — M24551 Contracture, right hip: Secondary | ICD-10-CM

## 2016-09-06 DIAGNOSIS — M999 Biomechanical lesion, unspecified: Secondary | ICD-10-CM | POA: Diagnosis not present

## 2016-09-06 DIAGNOSIS — M6289 Other specified disorders of muscle: Secondary | ICD-10-CM

## 2016-09-06 MED ORDER — VENLAFAXINE HCL ER 75 MG PO CP24: 75.0000 mg | ORAL_CAPSULE | Freq: Every day | ORAL | 1 refills | Status: DC

## 2016-09-06 NOTE — Assessment & Plan Note (Signed)
Decision today to treat with OMT was based on Physical Exam  After verbal consent patient was treated with HVLA, ME, FPR techniques in cervical, thoracic, lumbar and sacral areas  Patient tolerated the procedure well with improvement in symptoms  Patient given exercises, stretches and lifestyle modifications  See medications in patient instructions if given  Patient will follow up in 4 weeks 

## 2016-09-06 NOTE — Assessment & Plan Note (Addendum)
Patient is having significant trouble overall. We discussed with patient again at great length. Patient has had numerous incidents over the course of time that could've exacerbated the situation. Patient did have a long bike ride. Patient also had an injury when patient was pushing furniture this likely one of the exacerbating factors. There is a possibility for an obturator nerve impingement and patient will be sent to formal physical therapy but I think will be beneficial. We also discussed the possibility of continuing the core strengthening instability. Increase Effexor for nerve irritation that could be contribute in. If no significant improvement we do need further evaluation of patient's lumbar and pelvic areas with likely advance imaging like an MRI. Patient follow-up again in 4 weeks for further evaluation and treatment.

## 2016-09-06 NOTE — Progress Notes (Signed)
Kiara Lewis D.O. [facility] [address] [place], [place] [address] Phone: [phone] Subjective:    CC: Back, hip and leg pain f/u   [id number]:Subjective   Previous info from previous visit.  Kiara Lewis is a 61 y.o. female coming in with complaint of back, hip and leg pain. Seems to be left-sided. Patient states this all started when she was pushing furniture. Left leg felt a snap in the back of the leg. Likely from the patient and the furniture possibly causes. Patient also has other potential things that could cause including a fall when she slipped in the rain.   [facility] Patient did have x-rays of her left hip done. These were independently visualized by me. Patient did not have any significant osteoarthritic changes but was found to have calcified fibroid.  02/29/2016 update: Patient was to do home exercises, was with formal physical therapy. Was started on gabapentin. Patient was also started on nitroglycerin. Stated that she has had headaches with the nitroglycerin but not to any other significant side effects. Gabapentin did make her feel funny so discontinued after 2 days. Patient states that she continues to have the same discomfort. Seems to be more in the buttocks region radiating down the leg. Patient states that there is no weakness but she is missing on things in life including seen her mother turned [id number]. Patient states that it is also affecting her work. Unable to sit for longer than 20 minutes without severe pain.  Update 06/06/2016- patient was seen back in October and was given a piriformis injection. Patient was pain free for quite some time. States that she is having worsening symptoms again. Has been sitting a significant amount more and feels like this is contributing to it. Has been trying to do yoga but states that the stretching seems to be very painful. Mild radicular symptoms going down the leg. Patient states that it so severe it is  waking her up at night. Unable to find a comfortable position and seen the worse with sitting or laying down and seems to be better with standing or movement.  UpDate 06/20/2016-patient was previously seen and was given an injection in the piriformis again on 06/06/2016. Patient tolerated the procedure well and was to start increasing activity. Patient was having difficulty with stress as well. Patient states no significant improvement. Patient still has some pain. Seems to be mainly be more localized. Patient states that he concerning his possibly even worsening. Not having any significant improvement with the conservative therapy that did help previously.  06/30/16 update.  Seen last week and found to have possible mass vs abscess in area. Given Abx. Told to avoid certain activities.  Here for follow up.  Patient states significant improvement of the left side. Having more pain on the right side. States that there is still more of a tightness. No radiation down the leg. Patient is finally starting to feel like she is making progress though. Patient is really wanting to different tendon tenderness but is concerned and she will re-aggravate the area.  07/21/2016 update. Patient was seen previously and we did start osteopathic manipulation. Patient didn't respond fairly well. Patient was to start increasing activity as well as core strengthening exercises on a more regular basis. Patient states overall seems to be doing relatively well states that there is still some discomfort. States that the manipulation did help for approximately 5 days. Patient states now seems to be back to  her baseline. Has started playing tennis and doing other things.  08/29/2016 update-patient states that she is coming in with different pain. Seems to be more on the right side. Seems to be higher than usual. Was doing a lot of gardening. Feels that exacerbated it. Patient states that it is more of a spasming sensation. Different than  her other areas. Rates the severity pain is 7 out of 10. States localized with no radicular symptoms. No pain in the buttocks area in his usual.  09/06/2016 update- patient is still having significant amount of pain. Still some radiation going on the railing. Maybe not as severe as what it was previously. Patient states that the medication we're using for the nerve which is Effexor should be doing better. Patient denies any side effects. Patient states that this is more of a chronic issue and has not been significant improvement but still not as debilitating as what it was previously.  History reviewed. No pertinent past medical history. Past Surgical History:  Procedure Laterality Date  . APPENDECTOMY    . HERNIA REPAIR     Social History  Substance Use Topics  . Smoking status: Never Smoker  . Smokeless tobacco: Never Used  . Alcohol use Yes     Comment: OCCASSIONALLY   No Known Allergies Family History  Problem Relation Age of Onset  . Diabetes type II Mother   . Atrial fibrillation Father   . Rheumatic fever Father   . Atrial fibrillation Brother   . Cardiomyopathy Brother      Past medical history, social, surgical and family history all reviewed in electronic medical record.  No pertanent information unless stated regarding to the chief complaint.   Review of Systems: No headache, visual changes, nausea, vomiting, diarrhea, constipation, dizziness, abdominal pain, skin rash, fevers, chills, night sweats, weight loss, swollen lymph nodes, body aches, joint swelling,chest pain, shortness of breath, mood changes.   Positive muscle aches. Objective  Blood pressure 126/88, pulse 82, resp. rate 16, weight 131 lb (59.4 kg), SpO2 98 %.   Systems examined below as of 09/06/16 General: NAD A&O x3 mood, affect normal  HEENT: Pupils equal, extraocular movements intact no nystagmus Respiratory: not short of breath at rest or with speaking Cardiovascular: No lower extremity edema,  non tender Skin: Warm dry intact with no signs of infection or rash on extremities or on axial skeleton. Abdomen: Soft nontender, no masses Neuro: Cranial nerves  intact, neurovascularly intact in all extremities with 2+ DTRs and 2+ pulses. Lymph: No lymphadenopathy appreciated today  Gait normal with good balance and coordination.  MSK: Non tender with full range of motion and good stability and symmetric strength and tone of shoulders, elbows, wrist,  knee hips and ankles bilaterally.     Back Exam:  Inspection: Unremarkable  Motion: Flexion 35 deg, Extension 2 15 of more tightness than previous exam. Side Bending to 35 deg bilaterally,  Rotation to 35 deg bilaterally  SLR laying: Negative  XSLR laying: Negative  Palpable tenderness: Worsening tightness of the hip flexor from previous exams. FABER: Increasing tightness of the right side especially around the obturator muscle today. Sensory change: Gross sensation intact to all lumbar and sacral dermatomes.  Reflexes: 2+ at both patellar tendons, 2+ at achilles tendons, Babinski's downgoing.  Strength at foot  Plantar-flexion: 5/5 Dorsi-flexion: 5/5 Eversion: 5/5 Inversion: 5/5  Leg strength  Quad: 5/5 Hamstring: 5/5 Hip flexor: 5/5 Hip abductors: 5/5  Gait unremarkable.  Osteopathic findings C2 flexed rotated and side  bent right C4 flexed rotated and side bent left C7 flexed rotated and side bent left T3 extended rotated and side bent right inhaled third rib T7 extended rotated and side bent left L2 flexed rotated and side bent right Sacrum right on right

## 2016-09-06 NOTE — Patient Instructions (Addendum)
Good to see you  Effexor up to 75mg  daily for the nerve Pelvic floor dysfunction and we will get you in with PT  We will still consider the MRI of the back and pelvis if it continues See me again in 4 weeks.

## 2016-09-08 DIAGNOSIS — L820 Inflamed seborrheic keratosis: Secondary | ICD-10-CM | POA: Diagnosis not present

## 2016-09-08 DIAGNOSIS — D225 Melanocytic nevi of trunk: Secondary | ICD-10-CM | POA: Diagnosis not present

## 2016-09-08 DIAGNOSIS — D692 Other nonthrombocytopenic purpura: Secondary | ICD-10-CM | POA: Diagnosis not present

## 2016-09-08 DIAGNOSIS — I788 Other diseases of capillaries: Secondary | ICD-10-CM | POA: Diagnosis not present

## 2016-09-08 DIAGNOSIS — D485 Neoplasm of uncertain behavior of skin: Secondary | ICD-10-CM | POA: Diagnosis not present

## 2016-09-08 DIAGNOSIS — L218 Other seborrheic dermatitis: Secondary | ICD-10-CM | POA: Diagnosis not present

## 2016-09-08 DIAGNOSIS — D1801 Hemangioma of skin and subcutaneous tissue: Secondary | ICD-10-CM | POA: Diagnosis not present

## 2016-09-20 ENCOUNTER — Other Ambulatory Visit: Payer: Self-pay | Admitting: *Deleted

## 2016-09-20 ENCOUNTER — Other Ambulatory Visit: Payer: Self-pay | Admitting: Family Medicine

## 2016-09-20 ENCOUNTER — Telehealth: Payer: Self-pay | Admitting: General Practice

## 2016-09-20 ENCOUNTER — Encounter: Payer: Self-pay | Admitting: Family Medicine

## 2016-09-20 DIAGNOSIS — M5416 Radiculopathy, lumbar region: Secondary | ICD-10-CM

## 2016-09-20 DIAGNOSIS — N6320 Unspecified lump in the left breast, unspecified quadrant: Secondary | ICD-10-CM

## 2016-09-20 DIAGNOSIS — M7918 Myalgia, other site: Secondary | ICD-10-CM

## 2016-09-20 NOTE — Telephone Encounter (Signed)
Spoke to pt, she would like MRI orders faxed to [facility].  Done.

## 2016-09-20 NOTE — Telephone Encounter (Signed)
Pt would like a call back. Would not tell me what it is about.

## 2016-09-22 ENCOUNTER — Encounter: Payer: Self-pay | Admitting: Family Medicine

## 2016-09-22 ENCOUNTER — Ambulatory Visit (INDEPENDENT_AMBULATORY_CARE_PROVIDER_SITE_OTHER): Payer: BLUE CROSS/BLUE SHIELD | Admitting: Family Medicine

## 2016-09-22 DIAGNOSIS — M24551 Contracture, right hip: Secondary | ICD-10-CM | POA: Diagnosis not present

## 2016-09-22 DIAGNOSIS — S76312D Strain of muscle, fascia and tendon of the posterior muscle group at thigh level, left thigh, subsequent encounter: Secondary | ICD-10-CM | POA: Diagnosis not present

## 2016-09-22 MED ORDER — VITAMIN D (ERGOCALCIFEROL) 1.25 MG (50000 UNIT) PO CAPS: 50000.0000 [IU] | ORAL_CAPSULE | ORAL | 0 refills | Status: AC

## 2016-09-22 MED ORDER — VENLAFAXINE HCL ER 75 MG PO CP24: 75.0000 mg | ORAL_CAPSULE | Freq: Every day | ORAL | 1 refills | Status: DC

## 2016-09-22 MED ORDER — DIAZEPAM 10 MG PO TABS: 10.0000 mg | ORAL_TABLET | Freq: Two times a day (BID) | ORAL | 1 refills | Status: DC | PRN

## 2016-09-22 NOTE — Assessment & Plan Note (Signed)
Patient has significant tightness bilaterally concerned that something is potentially missing at this time. Failed all conservative therapy. Continue same medications. Patient was concerned with the new pain on the lateral aspect of the hip this could be intruding. We discussed with patient we will get the advance imaging of the MRI of the lumbar spine as well as the hip for further evaluation and treatment. Depending on findings this could change medical management. Patient is in agreement with the plan.

## 2016-09-22 NOTE — Assessment & Plan Note (Signed)
Still significant changes. No change in management. We'll see what MRI shows.

## 2016-09-22 NOTE — Patient Instructions (Addendum)
Good to see you  Lets get the MRI and see what is going on.  Valium 30 minutes before the test and can take before bed.  See me again as scheduled.

## 2016-09-22 NOTE — Progress Notes (Signed)
Kiara Lewis D.O. [facility] [address] [place], [place] [address] Phone: [phone] Subjective:    CC: Back, hip and leg pain f/u   BJY:NWGNFAOZHYHPI:Subjective   Previous info from previous visit.  Kiara Lewis is a 61 y.o. female coming in with complaint of back, hip and leg pain. Seems to be left-sided. Patient states this all started when she was pushing furniture. Left leg felt a snap in the back of the leg. Likely from the patient and the furniture possibly causes. Patient also has other potential things that could cause including a fall when she slipped in the rain.   [facility] . Patient did have x-rays of her left hip done. These were independently visualized by me. Patient did not have any significant osteoarthritic changes but was found to have calcified fibroid.  02/29/2016 update: Patient was to do home exercises, was with formal physical therapy. Was started on gabapentin. Patient was also started on nitroglycerin. Stated that she has had headaches with the nitroglycerin but not to any other significant side effects. Gabapentin did make her feel funny so discontinued after 2 days. Patient states that she continues to have the same discomfort. Seems to be more in the buttocks region radiating down the leg. Patient states that there is no weakness but she is missing on things in life including seen her mother turned 1990. Patient states that it is also affecting her work. Unable to sit for longer than 20 minutes without severe pain.  Update 06/06/2016- patient was seen back in October and was given a piriformis injection. Patient was pain free for quite some time. States that she is having worsening symptoms again. Has been sitting a significant amount more and feels like this is contributing to it. Has been trying to do yoga but states that the stretching seems to be very painful. Mild radicular symptoms going down the leg. Patient states that it so severe it is  waking her up at night. Unable to find a comfortable position and seen the worse with sitting or laying down and seems to be better with standing or movement.  UpDate 06/20/2016-patient was previously seen and was given an injection in the piriformis again on 06/06/2016. Patient tolerated the procedure well and was to start increasing activity. Patient was having difficulty with stress as well. Patient states no significant improvement. Patient still has some pain. Seems to be mainly be more localized. Patient states that he concerning his possibly even worsening. Not having any significant improvement with the conservative therapy that did help previously.  06/30/16 update.  Seen last week and found to have possible mass vs abscess in area. Given Abx. Told to avoid certain activities.  Here for follow up.  Patient states significant improvement of the left side. Having more pain on the right side. States that there is still more of a tightness. No radiation down the leg. Patient is finally starting to feel like she is making progress though. Patient is really wanting to different tendon tenderness but is concerned and she will re-aggravate the area.  07/21/2016 update. Patient was seen previously and we did start osteopathic manipulation. Patient didn't respond fairly well. Patient was to start increasing activity as well as core strengthening exercises on a more regular basis. Patient states overall seems to be doing relatively well states that there is still some discomfort. States that the manipulation did help for approximately 5 days. Patient states now seems to be back to  her baseline. Has started playing tennis and doing other things.  08/29/2016 update-patient states that she is coming in with different pain. Seems to be more on the right side. Seems to be higher than usual. Was doing a lot of gardening. Feels that exacerbated it. Patient states that it is more of a spasming sensation. Different than  her other areas. Rates the severity pain is 7 out of 10. States localized with no radicular symptoms. No pain in the buttocks area in his usual.  09/06/2016 update- patient is still having significant amount of pain. Still some radiation going on the railing. Maybe not as severe as what it was previously. Patient states that the medication we're using for the nerve which is Effexor should be doing better. Patient denies any side effects. Patient states that this is more of a chronic issue and has not been significant improvement but still not as debilitating as what it was previously.  09/22/2016 update-patient continued to have significant worsening symptoms. Patient states the pain was unrelenting. Patient is actually scheduled to have an MRI today. We were having patient rescheduled. Patient states that she wanted to come in anyway. Patient states tenderness ago she was sleeping on her right side. Had a significant amount of pain. Patient states since then she's been sleeping on the ground because the pain is changed. States that it went from the lateral aspect of the hip down to the anterior aspect of the thigh. Patient was concerned that this may change advance imaging that is scheduled in the next 48 hours. Otherwise continues to have the buttocks pain in the same back pain. The Effexor has helped some of the radiation of pain as well as some of her mood.  No past medical history on file. Past Surgical History:  Procedure Laterality Date  . APPENDECTOMY    . HERNIA REPAIR     Social History  Substance Use Topics  . Smoking status: Never Smoker  . Smokeless tobacco: Never Used  . Alcohol use Yes     Comment: OCCASSIONALLY   No Known Allergies Family History  Problem Relation Age of Onset  . Diabetes type II Mother   . Atrial fibrillation Father   . Rheumatic fever Father   . Atrial fibrillation Brother   . Cardiomyopathy Brother      Past medical history, social, surgical and  family history all reviewed in electronic medical record.  No pertanent information unless stated regarding to the chief complaint.   Review of Systems: No headache, visual changes, nausea, vomiting, diarrhea, constipation, dizziness, abdominal pain, skin rash, fevers, chills, night sweats, weight loss, swollen lymph nodes, body aches, joint swelling, chest pain, shortness of breath, mood changes.  Positive muscle aches  Objective  Blood pressure 128/80, pulse 80, SpO2 96 %.   Systems examined below as of 09/22/16 General: NAD A&O x3 mood, affect normal  HEENT: Pupils equal, extraocular movements intact no nystagmus Respiratory: not short of breath at rest or with speaking Cardiovascular: No lower extremity edema, non tender Skin: Warm dry intact with no signs of infection or rash on extremities or on axial skeleton. Abdomen: Soft nontender, no masses Neuro: Cranial nerves  intact, neurovascularly intact in all extremities with 2+ DTRs and 2+ pulses. Lymph: No lymphadenopathy appreciated today  Gait normal with good balance and coordination.  MSK: Non tender with full range of motion and good stability and symmetric strength and tone of shoulders, elbows, wrist,  knee hips and ankles bilaterally.  Back Exam:  Inspection: Mild loss of lordosis with usual Motion: Flexion 30 deg, Extension 25 deg, Side Bending to 30 deg bilaterally,  Rotation to 30 deg bilaterally  SLR laying: Positive right side XSLR laying: Negative  Palpable tenderness: Severe tenderness to palpation.Marland Kitchen FABER: Tightness bilaterally record of the left. Sensory change: Gross sensation intact to all lumbar and sacral dermatomes.  Reflexes: 2+ at both patellar tendons, 2+ at achilles tendons, Babinski's downgoing.  Strength at foot  4 out of 5 but symmetric.

## 2016-09-23 ENCOUNTER — Other Ambulatory Visit: Payer: BLUE CROSS/BLUE SHIELD

## 2016-09-24 ENCOUNTER — Ambulatory Visit
Admission: RE | Admit: 2016-09-24 | Discharge: 2016-09-24 | Disposition: A | Discharge status: Home / Self Care | Payer: BLUE CROSS/BLUE SHIELD | Source: Ambulatory Visit | Attending: Family Medicine | Admitting: Family Medicine

## 2016-09-24 DIAGNOSIS — M7918 Myalgia, other site: Secondary | ICD-10-CM

## 2016-09-24 DIAGNOSIS — D259 Leiomyoma of uterus, unspecified: Secondary | ICD-10-CM | POA: Diagnosis not present

## 2016-09-25 ENCOUNTER — Other Ambulatory Visit: Payer: BLUE CROSS/BLUE SHIELD

## 2016-09-25 ENCOUNTER — Ambulatory Visit
Admission: RE | Admit: 2016-09-25 | Discharge: 2016-09-25 | Disposition: A | Discharge status: Home / Self Care | Payer: BLUE CROSS/BLUE SHIELD | Source: Ambulatory Visit | Attending: Family Medicine | Admitting: Family Medicine

## 2016-09-25 DIAGNOSIS — M5416 Radiculopathy, lumbar region: Secondary | ICD-10-CM

## 2016-09-25 DIAGNOSIS — M48061 Spinal stenosis, lumbar region without neurogenic claudication: Secondary | ICD-10-CM | POA: Diagnosis not present

## 2016-09-27 ENCOUNTER — Ambulatory Visit (INDEPENDENT_AMBULATORY_CARE_PROVIDER_SITE_OTHER): Payer: BLUE CROSS/BLUE SHIELD | Admitting: Family Medicine

## 2016-09-27 ENCOUNTER — Encounter: Payer: Self-pay | Admitting: Family Medicine

## 2016-09-27 VITALS — BP 122/78 | HR 72

## 2016-09-27 DIAGNOSIS — M5416 Radiculopathy, lumbar region: Secondary | ICD-10-CM | POA: Diagnosis not present

## 2016-09-27 MED ORDER — DIAZEPAM 10 MG PO TABS: 10.0000 mg | ORAL_TABLET | Freq: Every evening | ORAL | 1 refills | Status: AC | PRN

## 2016-09-27 NOTE — Progress Notes (Signed)
[facility] [address] [place], [place] [address] Phone: [phone] Subjective:    CC: Back, hip and leg pain f/u   BJY:NWGNFAOZHY   Previous info from previous visit.  Kiara Lewis is a 61 y.o. female coming in with complaint of back, hip and leg pain. Seems to be left-sided.Was having worsening symptoms.   patient was seen by [facility] . Patient did have x-rays of her left hip done. These were independently visualized by me. Patient did not have any significant osteoarthritic changes but was found to have calcified fibroid.  02/29/2016 update: Patient was to do home exercises, was with formal physical therapy. Was started on gabapentin. Patient was also started on nitroglycerin. Stated that she has had headaches with the nitroglycerin but not to any other significant side effects. Gabapentin did make her feel funny so discontinued after 2 days. Patient states that she continues to have the same discomfort. Seems to be more in the buttocks region radiating down the leg. Patient states that there is no weakness but she is missing on things in life including seen her mother turned 64. Patient states that it is also affecting her work. Unable to sit for longer than 20 minutes without severe pain.  Update 06/06/2016- patient was seen back in October and was given a piriformis injection. Patient was pain free for quite some time. States that she is having worsening symptoms again. Has been sitting a significant amount more and feels like this is contributing to it. Has been trying to do yoga but states that the stretching seems to be very painful. Mild radicular symptoms going down the leg. Patient states that it so severe it is waking her up at night. Unable to find a comfortable position and seen the worse with sitting or laying down and seems to be better with standing or movement.  UpDate 06/20/2016-patient was previously seen and was given an injection in the  piriformis again on 06/06/2016. Patient tolerated the procedure well and was to start increasing activity. Patient was having difficulty with stress as well. Patient states no significant improvement. Patient still has some pain. Seems to be mainly be more localized. Patient states that he concerning his possibly even worsening. Not having any significant improvement with the conservative therapy that did help previously.  06/30/16 update.  Seen last week and found to have possible mass vs abscess in area. Given Abx. Told to avoid certain activities.  Here for follow up.  Patient states significant improvement of the left side. Having more pain on the right side. States that there is still more of a tightness. No radiation down the leg. Patient is finally starting to feel like she is making progress though. Patient is really wanting to different tendon tenderness but is concerned and she will re-aggravate the area.  07/21/2016 update. Patient was seen previously and we did start osteopathic manipulation. Patient didn't respond fairly well. Patient was to start increasing activity as well as core strengthening exercises on a more regular basis. Patient states overall seems to be doing relatively well states that there is still some discomfort. States that the manipulation did help for approximately 5 days. Patient states now seems to be back to her baseline. Has started playing tennis and doing other things.  08/29/2016 update-patient states that she is coming in with different pain. Seems to be more on the right side. Seems to be higher than usual. Was doing a lot of gardening. Feels that exacerbated  it. Patient states that it is more of a spasming sensation. Different than her other areas. Rates the severity pain is 7 out of 10. States localized with no radicular symptoms. No pain in the buttocks area in his usual.  09/06/2016 update- patient is still having significant amount of pain. Still some radiation  going on the railing. Maybe not as severe as what it was previously. Patient states that the medication we're using for the nerve which is Effexor should be doing better. Patient denies any side effects. Patient states that this is more of a chronic issue and has not been significant improvement but still not as debilitating as what it was previously.  09/22/2016 update-patient continued to have significant worsening symptoms. Patient states the pain was unrelenting. Patient is actually scheduled to have an MRI today. We were having patient rescheduled. Patient states that she wanted to come in anyway. Patient states tenderness ago she was sleeping on her right side. Had a significant amount of pain. Patient states since then she's been sleeping on the ground because the pain is changed. States that it went from the lateral aspect of the hip down to the anterior aspect of the thigh. Patient was concerned that this may change advance imaging that is scheduled in the next 48 hours. Otherwise continues to have the buttocks pain in the same back pain. The Effexor has helped some of the radiation of pain as well as some of her mood.  09/27/2016-patient did have the MRI. MRI did show the patient did have possible nerve impingement from L3-S1. With patient's symptoms it does appear to be more of a left-sided nerve root impingement. Patient has been doing all the home exercises and still making no significant improvement. Patient continues to have a dull, throbbing aching pain. Patient is taking the Valium at night for sleep. Patient does not have a history of any anxiety. Still chronic issue. Continue radicular symptoms.  Imaging report: 1. 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 2. Mild left foraminal stenosis at L4-5 due to a small disc protrusion and facet arthrosis. 3. Shallow L5-S1 disc protrusion potentially contacting the S1 nerve roots  bilaterally.  No past medical history on file. Past Surgical History:  Procedure Laterality Date  . APPENDECTOMY    . HERNIA REPAIR     Social History  Substance Use Topics  . Smoking status: Never Smoker  . Smokeless tobacco: Never Used  . Alcohol use Yes     Comment: OCCASSIONALLY   No Known Allergies Family History  Problem Relation Age of Onset  . Diabetes type II Mother   . Atrial fibrillation Father   . Rheumatic fever Father   . Atrial fibrillation Brother   . Cardiomyopathy Brother      Past medical history, social, surgical and family history all reviewed in electronic medical record.  No pertanent information unless stated regarding to the chief complaint.   Review of Systems: No headache, visual changes, nausea, vomiting, diarrhea, constipation, dizziness, abdominal pain, skin rash, fevers, chills, night sweats, weight loss, swollen lymph nodes, body aches, joint swelling, chest pain, shortness of breath, mood changes.  Positive muscle ache  Objective  Blood pressure 122/78, pulse 72, SpO2 96 %.   Systems examined below as of 09/27/16 General: NAD A&O x3 mood, affect normal  HEENT: Pupils equal, extraocular movements intact no nystagmus Respiratory: not short of breath at rest or with speaking Cardiovascular: No lower extremity edema, non tender  Skin: Warm dry intact with no signs of infection or rash on extremities or on axial skeleton. Abdomen: Soft nontender, no masses Neuro: Cranial nerves  intact, neurovascularly intact in all extremities with 2+ DTRs and 2+ pulses. Lymph: No lymphadenopathy appreciated today  Gait normal with good balance and coordination.  MSK: Non tender with full range of motion and good stability and symmetric strength and tone of shoulders, elbows, wrist,  knee hips and ankles bilaterally.    Back Exam:  Inspection: Unremarkable  Motion: Flexion 35 deg, Extension 25 deg, Side Bending to 35 deg bilaterally,  Rotation to 35 deg  bilaterally  SLR laying: Positive still noted left greater then right XSLR laying: Negative  Palpable tenderness: Tender to palpation in the paraspinal musculature mostly on the left sign.Marland Kitchen. FABER: negative. Sensory change: Gross sensation intact to all lumbar and sacral dermatomes.  Reflexes: 2+ at both patellar tendons, 2+ at achilles tendons, Babinski's downgoing.  Strength at foot  Plantar-flexion: 5/5 Dorsi-flexion: 5/5 Eversion: 5/5 Inversion: 5/5  Leg strength  Quad: 5/5 Hamstring: 5/5 Hip flexor: 5/5 Hip abductors: 5/5  Gait unremarkable.

## 2016-09-27 NOTE — Patient Instructions (Signed)
Good to see you  We will get the injection  I am so happy we likely found it Send me a message 1 week after injection and see me 2 weeks AFTER injection, I promise we will get you in .

## 2016-09-27 NOTE — Assessment & Plan Note (Signed)
Patient's MRI does show that patient likely has more of a lumbar radiculopathy. We discussed with patient at great length including all being on the left sign. Patient's symptoms correspond with an S1 nerve root impingement and patient's MRI does seem to be worse in that area. Patient has elected try a nerve root injection. We discussed the possibility of an epidural as well as facet injections. Patient states that she would like to start with a nerve root injection and see how patient responds. Likely patient is not on any blood thinners and I do think that she is a good candidate to respond to these injections as well as this being diagnostic. Depending on results we'll discuss further treatment options thereafter.  Spent  25 minutes with patient face-to-face and had greater than 50% of counseling including as described above in assessment and plan including different choices, diagnostic procedures, as well as medications..Marland Kitchen

## 2016-09-28 ENCOUNTER — Encounter: Payer: Self-pay | Admitting: Family Medicine

## 2016-09-28 NOTE — Telephone Encounter (Signed)
Pt called in to follow up on this my chart message

## 2016-09-29 ENCOUNTER — Encounter: Payer: Self-pay | Admitting: Family Medicine

## 2016-09-29 ENCOUNTER — Other Ambulatory Visit: Payer: BLUE CROSS/BLUE SHIELD

## 2016-10-03 ENCOUNTER — Other Ambulatory Visit: Payer: BLUE CROSS/BLUE SHIELD

## 2016-10-04 ENCOUNTER — Other Ambulatory Visit: Payer: BLUE CROSS/BLUE SHIELD

## 2016-10-11 ENCOUNTER — Ambulatory Visit
Admission: RE | Admit: 2016-10-11 | Discharge: 2016-10-11 | Disposition: A | Discharge status: Home / Self Care | Payer: BLUE CROSS/BLUE SHIELD | Source: Ambulatory Visit | Attending: Family Medicine | Admitting: Family Medicine

## 2016-10-11 DIAGNOSIS — R922 Inconclusive mammogram: Secondary | ICD-10-CM | POA: Diagnosis not present

## 2016-10-11 DIAGNOSIS — N6320 Unspecified lump in the left breast, unspecified quadrant: Secondary | ICD-10-CM

## 2016-10-24 ENCOUNTER — Other Ambulatory Visit: Payer: Self-pay | Admitting: *Deleted

## 2016-10-24 MED ORDER — VENLAFAXINE HCL ER 75 MG PO CP24: 75.0000 mg | ORAL_CAPSULE | Freq: Every day | ORAL | 1 refills | Status: DC

## 2016-10-24 NOTE — Telephone Encounter (Signed)
Refill done.  

## 2016-10-25 DIAGNOSIS — M9903 Segmental and somatic dysfunction of lumbar region: Secondary | ICD-10-CM | POA: Diagnosis not present

## 2016-10-26 DIAGNOSIS — M9903 Segmental and somatic dysfunction of lumbar region: Secondary | ICD-10-CM | POA: Diagnosis not present

## 2016-10-27 DIAGNOSIS — M9903 Segmental and somatic dysfunction of lumbar region: Secondary | ICD-10-CM | POA: Diagnosis not present

## 2016-10-27 DIAGNOSIS — M9904 Segmental and somatic dysfunction of sacral region: Secondary | ICD-10-CM | POA: Diagnosis not present

## 2016-10-27 DIAGNOSIS — Q72812 Congenital shortening of left lower limb: Secondary | ICD-10-CM | POA: Diagnosis not present

## 2016-10-27 DIAGNOSIS — M25551 Pain in right hip: Secondary | ICD-10-CM | POA: Diagnosis not present

## 2016-10-31 DIAGNOSIS — M9903 Segmental and somatic dysfunction of lumbar region: Secondary | ICD-10-CM | POA: Diagnosis not present

## 2016-11-07 DIAGNOSIS — M9904 Segmental and somatic dysfunction of sacral region: Secondary | ICD-10-CM | POA: Diagnosis not present

## 2016-11-07 DIAGNOSIS — Q72812 Congenital shortening of left lower limb: Secondary | ICD-10-CM | POA: Diagnosis not present

## 2016-11-07 DIAGNOSIS — M25551 Pain in right hip: Secondary | ICD-10-CM | POA: Diagnosis not present

## 2016-11-07 DIAGNOSIS — M9903 Segmental and somatic dysfunction of lumbar region: Secondary | ICD-10-CM | POA: Diagnosis not present

## 2016-11-09 DIAGNOSIS — M50322 Other cervical disc degeneration at C5-C6 level: Secondary | ICD-10-CM | POA: Diagnosis not present

## 2016-11-09 DIAGNOSIS — M9903 Segmental and somatic dysfunction of lumbar region: Secondary | ICD-10-CM | POA: Diagnosis not present

## 2016-11-09 DIAGNOSIS — Q72812 Congenital shortening of left lower limb: Secondary | ICD-10-CM | POA: Diagnosis not present

## 2016-11-09 DIAGNOSIS — M9901 Segmental and somatic dysfunction of cervical region: Secondary | ICD-10-CM | POA: Diagnosis not present

## 2016-11-14 DIAGNOSIS — M50322 Other cervical disc degeneration at C5-C6 level: Secondary | ICD-10-CM | POA: Diagnosis not present

## 2016-11-14 DIAGNOSIS — M9903 Segmental and somatic dysfunction of lumbar region: Secondary | ICD-10-CM | POA: Diagnosis not present

## 2016-11-14 DIAGNOSIS — Q72812 Congenital shortening of left lower limb: Secondary | ICD-10-CM | POA: Diagnosis not present

## 2016-11-14 DIAGNOSIS — M9901 Segmental and somatic dysfunction of cervical region: Secondary | ICD-10-CM | POA: Diagnosis not present

## 2016-11-17 DIAGNOSIS — M9901 Segmental and somatic dysfunction of cervical region: Secondary | ICD-10-CM | POA: Diagnosis not present

## 2016-11-17 DIAGNOSIS — M50322 Other cervical disc degeneration at C5-C6 level: Secondary | ICD-10-CM | POA: Diagnosis not present

## 2016-11-17 DIAGNOSIS — Q72812 Congenital shortening of left lower limb: Secondary | ICD-10-CM | POA: Diagnosis not present

## 2016-11-17 DIAGNOSIS — M9903 Segmental and somatic dysfunction of lumbar region: Secondary | ICD-10-CM | POA: Diagnosis not present

## 2016-11-21 DIAGNOSIS — M9901 Segmental and somatic dysfunction of cervical region: Secondary | ICD-10-CM | POA: Diagnosis not present

## 2016-11-21 DIAGNOSIS — Q72812 Congenital shortening of left lower limb: Secondary | ICD-10-CM | POA: Diagnosis not present

## 2016-11-21 DIAGNOSIS — M9903 Segmental and somatic dysfunction of lumbar region: Secondary | ICD-10-CM | POA: Diagnosis not present

## 2016-11-21 DIAGNOSIS — M50322 Other cervical disc degeneration at C5-C6 level: Secondary | ICD-10-CM | POA: Diagnosis not present

## 2016-11-22 DIAGNOSIS — N951 Menopausal and female climacteric states: Secondary | ICD-10-CM | POA: Diagnosis not present

## 2016-11-22 DIAGNOSIS — F419 Anxiety disorder, unspecified: Secondary | ICD-10-CM | POA: Diagnosis not present

## 2016-11-22 DIAGNOSIS — E039 Hypothyroidism, unspecified: Secondary | ICD-10-CM | POA: Diagnosis not present

## 2016-11-24 DIAGNOSIS — M50322 Other cervical disc degeneration at C5-C6 level: Secondary | ICD-10-CM | POA: Diagnosis not present

## 2016-11-24 DIAGNOSIS — M9903 Segmental and somatic dysfunction of lumbar region: Secondary | ICD-10-CM | POA: Diagnosis not present

## 2016-11-24 DIAGNOSIS — M9901 Segmental and somatic dysfunction of cervical region: Secondary | ICD-10-CM | POA: Diagnosis not present

## 2016-11-24 DIAGNOSIS — Q72812 Congenital shortening of left lower limb: Secondary | ICD-10-CM | POA: Diagnosis not present

## 2016-11-28 DIAGNOSIS — M9903 Segmental and somatic dysfunction of lumbar region: Secondary | ICD-10-CM | POA: Diagnosis not present

## 2016-11-28 DIAGNOSIS — Q72812 Congenital shortening of left lower limb: Secondary | ICD-10-CM | POA: Diagnosis not present

## 2016-11-28 DIAGNOSIS — M9901 Segmental and somatic dysfunction of cervical region: Secondary | ICD-10-CM | POA: Diagnosis not present

## 2016-11-28 DIAGNOSIS — M50322 Other cervical disc degeneration at C5-C6 level: Secondary | ICD-10-CM | POA: Diagnosis not present

## 2016-11-29 DIAGNOSIS — Q72812 Congenital shortening of left lower limb: Secondary | ICD-10-CM | POA: Diagnosis not present

## 2016-11-29 DIAGNOSIS — M9903 Segmental and somatic dysfunction of lumbar region: Secondary | ICD-10-CM | POA: Diagnosis not present

## 2016-11-29 DIAGNOSIS — M9901 Segmental and somatic dysfunction of cervical region: Secondary | ICD-10-CM | POA: Diagnosis not present

## 2016-11-29 DIAGNOSIS — M50322 Other cervical disc degeneration at C5-C6 level: Secondary | ICD-10-CM | POA: Diagnosis not present

## 2016-12-01 DIAGNOSIS — M50322 Other cervical disc degeneration at C5-C6 level: Secondary | ICD-10-CM | POA: Diagnosis not present

## 2016-12-01 DIAGNOSIS — Q72812 Congenital shortening of left lower limb: Secondary | ICD-10-CM | POA: Diagnosis not present

## 2016-12-01 DIAGNOSIS — M9901 Segmental and somatic dysfunction of cervical region: Secondary | ICD-10-CM | POA: Diagnosis not present

## 2016-12-01 DIAGNOSIS — M9903 Segmental and somatic dysfunction of lumbar region: Secondary | ICD-10-CM | POA: Diagnosis not present

## 2016-12-07 DIAGNOSIS — M9903 Segmental and somatic dysfunction of lumbar region: Secondary | ICD-10-CM | POA: Diagnosis not present

## 2016-12-07 DIAGNOSIS — Q72812 Congenital shortening of left lower limb: Secondary | ICD-10-CM | POA: Diagnosis not present

## 2016-12-07 DIAGNOSIS — M9901 Segmental and somatic dysfunction of cervical region: Secondary | ICD-10-CM | POA: Diagnosis not present

## 2016-12-07 DIAGNOSIS — M50322 Other cervical disc degeneration at C5-C6 level: Secondary | ICD-10-CM | POA: Diagnosis not present

## 2016-12-19 DIAGNOSIS — M50322 Other cervical disc degeneration at C5-C6 level: Secondary | ICD-10-CM | POA: Diagnosis not present

## 2016-12-19 DIAGNOSIS — M9901 Segmental and somatic dysfunction of cervical region: Secondary | ICD-10-CM | POA: Diagnosis not present

## 2016-12-19 DIAGNOSIS — Q72812 Congenital shortening of left lower limb: Secondary | ICD-10-CM | POA: Diagnosis not present

## 2016-12-19 DIAGNOSIS — M9903 Segmental and somatic dysfunction of lumbar region: Secondary | ICD-10-CM | POA: Diagnosis not present

## 2016-12-26 DIAGNOSIS — M9903 Segmental and somatic dysfunction of lumbar region: Secondary | ICD-10-CM | POA: Diagnosis not present

## 2016-12-26 DIAGNOSIS — M9901 Segmental and somatic dysfunction of cervical region: Secondary | ICD-10-CM | POA: Diagnosis not present

## 2016-12-26 DIAGNOSIS — Q72812 Congenital shortening of left lower limb: Secondary | ICD-10-CM | POA: Diagnosis not present

## 2016-12-26 DIAGNOSIS — M50322 Other cervical disc degeneration at C5-C6 level: Secondary | ICD-10-CM | POA: Diagnosis not present

## 2016-12-29 ENCOUNTER — Ambulatory Visit (INDEPENDENT_AMBULATORY_CARE_PROVIDER_SITE_OTHER): Payer: BLUE CROSS/BLUE SHIELD | Admitting: Sports Medicine

## 2016-12-29 ENCOUNTER — Encounter: Payer: Self-pay | Admitting: Sports Medicine

## 2016-12-29 VITALS — BP 110/70 | Ht 66.0 in | Wt 132.0 lb

## 2016-12-29 DIAGNOSIS — M9901 Segmental and somatic dysfunction of cervical region: Secondary | ICD-10-CM | POA: Diagnosis not present

## 2016-12-29 DIAGNOSIS — M5416 Radiculopathy, lumbar region: Secondary | ICD-10-CM | POA: Diagnosis not present

## 2016-12-29 DIAGNOSIS — M50322 Other cervical disc degeneration at C5-C6 level: Secondary | ICD-10-CM | POA: Diagnosis not present

## 2016-12-29 DIAGNOSIS — Q72812 Congenital shortening of left lower limb: Secondary | ICD-10-CM | POA: Diagnosis not present

## 2016-12-29 DIAGNOSIS — M9903 Segmental and somatic dysfunction of lumbar region: Secondary | ICD-10-CM | POA: Diagnosis not present

## 2016-12-29 MED ORDER — VENLAFAXINE HCL ER 75 MG PO CP24: 75.0000 mg | ORAL_CAPSULE | Freq: Two times a day (BID) | ORAL | 1 refills | Status: DC

## 2016-12-29 NOTE — Assessment & Plan Note (Addendum)
Currently without radicular symptoms however ongoing lumbar back pain likely secondary to spinal stenosis as evidenced by MRI results. Additionally, suspect some of her pain is likely related to recent stressors with the declining health of her father who is in palliative care.   -Advised to continue yoga and pilates; leg extension exercises and pelvic tilt may help -Recommended taking several short walks about 10 min a day  -Referral to PT provided  -Advised to use a lumbar pad for car when driving to provide additional support -Will increase Venlafaxine to 75 mg BID to help with muscle spasm and lower back pain  -follow up prn 4 to 6 wks

## 2016-12-29 NOTE — Patient Instructions (Signed)
You were seen in clinic for your lower back pain which is due to your spinal stenosis.   As we discussed, there are several options for you to manage this including :  -physical therapy sessions -you may continue yoga and pilates as you did before -use lumbar pads for support in the car when on long drives to help with your back pain -Increase your Venlafaxine to 75 mg twice a day for muscular pain -recommend short 10 minute walks 2-3 times a day as tolerated -home exercises as demonstrated: pelvic tilt and leg extensions while laying on your back

## 2016-12-29 NOTE — Progress Notes (Signed)
Subjective:    Patient ID: Kiara Lewis, female    DOB: 1956/02/05, 61 y.o.   MRN: [id number]  HPI 61 yo female presenting with lumbar back pain for about 10 months duration. She sustained a mechanical fall while running in the rain and landed on concrete.  Reports she hit her back when she fell.  XR at the time showed no fractures however she has been struggling with intermittent pain during this time. Pain is localized to lumbar back and does not radiate into her legs.   Dr. Katrinka Blazing had performed an ultrasound-guided SI joint injection about 6 months ago which provided some relief for about 3 months, however her pain has since returned.  She takes Curcumin for inflammation but does not take anything stronger as these tend to bother her stomach.  Heating pads have helped some.   She has tried physical therapy in the past as well which helped in addition to yoga which she frequently practices.  She notes any back flexion is very painful and will send her muscles into spasm.  She is more comfortable in the sitting position, particularly when sitting against something firm and structured.  Pain is worse with driving as she sometimes drives for long periods of time and car seat is soft.  Has had an MRI on 5/27 which showed facet arthrosis and left L4-L5 stenosis with small disc protrusion.  She denies swelling.   Denies numbness and tingling.    Fam HX Father has serious illness in [place] She has been traveling to try to spend time with him Son with school issues  Review of Systems  Cardiovascular: Negative for leg swelling.  Musculoskeletal: Negative for joint swelling.  Neurological: Negative for weakness and numbness.  Night time awakening with worries about above Venlaxafine has helped   Objective:   Physical Exam 61 yo female, NAD.  Sitting comfortably in exam room.  BP 110/70   Ht 5\' 6"  (1.676 m)   Wt 132 lb (59.9 kg)   BMI 21.31 kg/m    Back - Lumbar spine:  Normal  appearance with no swelling, bruising or erythema present.   Limited ROM with flexion as patient is unable to do so.  Extension mildly limited.   No tenderness over spinous processes or paraspinal muscles of lumbar spine bilaterally.  No SI joint tenderness.  Minimal movement of SI joint.  Limited motion of facet joints over lumbar spine.  Strength 5/5 bilaterally with normal sensation.   Negative straight leg test.  Neurovascularly intact distally.   Toe and heel walk normal Tandem walk normal Flexion in chair is pain free  Review of MRI - seems that she has several levels of lumbar degnerative disk bulging and facet change;  No disc herniation    Assessment & Plan:   Lumbar back pain  Currently without radicular symptoms however ongoing lumbar back pain likely secondary to spinal stenosis as evidenced by MRI results. Additionally, suspect some of her pain is likely related to recent stressors with the declining health of her father who is in palliative care.   -Advised to continue yoga and pilates; willaims flexion exercises and pelvic tilt may help -Recommended taking several short walks about 10 min a walk x 3  -Referral to PT provided - trial with Candise Bowens Pa and with Pilates approach -Advised to use a lumbar pad for car when driving to provide additional support -Will increase Venlafaxine to 75 mg BID to help with muscle spasm and lower  back pain  -follow up prn  4 to 6 weeks

## 2017-01-05 DIAGNOSIS — M9903 Segmental and somatic dysfunction of lumbar region: Secondary | ICD-10-CM | POA: Diagnosis not present

## 2017-01-05 DIAGNOSIS — M50322 Other cervical disc degeneration at C5-C6 level: Secondary | ICD-10-CM | POA: Diagnosis not present

## 2017-01-05 DIAGNOSIS — M9901 Segmental and somatic dysfunction of cervical region: Secondary | ICD-10-CM | POA: Diagnosis not present

## 2017-01-05 DIAGNOSIS — Q72812 Congenital shortening of left lower limb: Secondary | ICD-10-CM | POA: Diagnosis not present

## 2017-01-10 ENCOUNTER — Telehealth: Payer: Self-pay | Admitting: Physical Therapy

## 2017-01-10 NOTE — Telephone Encounter (Signed)
01/03/17 & 01/10/17 left message to call to schedule PT eval, no return call

## 2017-01-12 DIAGNOSIS — Q72812 Congenital shortening of left lower limb: Secondary | ICD-10-CM | POA: Diagnosis not present

## 2017-01-12 DIAGNOSIS — M9901 Segmental and somatic dysfunction of cervical region: Secondary | ICD-10-CM | POA: Diagnosis not present

## 2017-01-12 DIAGNOSIS — M9903 Segmental and somatic dysfunction of lumbar region: Secondary | ICD-10-CM | POA: Diagnosis not present

## 2017-01-12 DIAGNOSIS — M50322 Other cervical disc degeneration at C5-C6 level: Secondary | ICD-10-CM | POA: Diagnosis not present

## 2017-01-19 DIAGNOSIS — M50322 Other cervical disc degeneration at C5-C6 level: Secondary | ICD-10-CM | POA: Diagnosis not present

## 2017-01-19 DIAGNOSIS — M9903 Segmental and somatic dysfunction of lumbar region: Secondary | ICD-10-CM | POA: Diagnosis not present

## 2017-01-19 DIAGNOSIS — M9901 Segmental and somatic dysfunction of cervical region: Secondary | ICD-10-CM | POA: Diagnosis not present

## 2017-01-19 DIAGNOSIS — Q72812 Congenital shortening of left lower limb: Secondary | ICD-10-CM | POA: Diagnosis not present

## 2017-01-30 DIAGNOSIS — F419 Anxiety disorder, unspecified: Secondary | ICD-10-CM | POA: Diagnosis not present

## 2017-01-30 DIAGNOSIS — I8390 Asymptomatic varicose veins of unspecified lower extremity: Secondary | ICD-10-CM | POA: Diagnosis not present

## 2017-01-30 DIAGNOSIS — N951 Menopausal and female climacteric states: Secondary | ICD-10-CM | POA: Diagnosis not present

## 2017-01-30 DIAGNOSIS — R6882 Decreased libido: Secondary | ICD-10-CM | POA: Diagnosis not present

## 2017-06-09 ENCOUNTER — Other Ambulatory Visit: Payer: Self-pay | Admitting: Sports Medicine

## 2017-07-11 ENCOUNTER — Other Ambulatory Visit: Payer: Self-pay

## 2017-07-11 DIAGNOSIS — N951 Menopausal and female climacteric states: Secondary | ICD-10-CM | POA: Diagnosis not present

## 2017-07-11 DIAGNOSIS — F419 Anxiety disorder, unspecified: Secondary | ICD-10-CM | POA: Diagnosis not present

## 2017-07-11 DIAGNOSIS — I8390 Asymptomatic varicose veins of unspecified lower extremity: Secondary | ICD-10-CM | POA: Diagnosis not present

## 2017-07-11 DIAGNOSIS — R6882 Decreased libido: Secondary | ICD-10-CM | POA: Diagnosis not present

## 2017-07-11 MED ORDER — VENLAFAXINE HCL ER 75 MG PO CP24: 75.0000 mg | ORAL_CAPSULE | Freq: Two times a day (BID) | ORAL | 1 refills | Status: AC

## 2017-07-25 DIAGNOSIS — E039 Hypothyroidism, unspecified: Secondary | ICD-10-CM | POA: Diagnosis not present

## 2017-07-25 DIAGNOSIS — R6882 Decreased libido: Secondary | ICD-10-CM | POA: Diagnosis not present

## 2017-07-25 DIAGNOSIS — N951 Menopausal and female climacteric states: Secondary | ICD-10-CM | POA: Diagnosis not present

## 2017-07-25 DIAGNOSIS — F419 Anxiety disorder, unspecified: Secondary | ICD-10-CM | POA: Diagnosis not present

## 2017-09-05 DIAGNOSIS — L821 Other seborrheic keratosis: Secondary | ICD-10-CM | POA: Diagnosis not present

## 2017-09-05 DIAGNOSIS — D225 Melanocytic nevi of trunk: Secondary | ICD-10-CM | POA: Diagnosis not present

## 2017-09-05 DIAGNOSIS — L814 Other melanin hyperpigmentation: Secondary | ICD-10-CM | POA: Diagnosis not present

## 2017-09-05 DIAGNOSIS — L738 Other specified follicular disorders: Secondary | ICD-10-CM | POA: Diagnosis not present

## 2017-09-07 ENCOUNTER — Other Ambulatory Visit: Payer: Self-pay | Admitting: Family Medicine

## 2017-09-07 DIAGNOSIS — Z1231 Encounter for screening mammogram for malignant neoplasm of breast: Secondary | ICD-10-CM

## 2017-10-17 ENCOUNTER — Ambulatory Visit: Payer: BLUE CROSS/BLUE SHIELD

## 2017-11-01 ENCOUNTER — Ambulatory Visit: Payer: Self-pay

## 2017-11-10 ENCOUNTER — Ambulatory Visit: Payer: Self-pay

## 2018-01-09 DIAGNOSIS — E039 Hypothyroidism, unspecified: Secondary | ICD-10-CM | POA: Diagnosis not present

## 2018-01-09 DIAGNOSIS — N951 Menopausal and female climacteric states: Secondary | ICD-10-CM | POA: Diagnosis not present

## 2018-01-09 DIAGNOSIS — R6882 Decreased libido: Secondary | ICD-10-CM | POA: Diagnosis not present

## 2018-01-09 DIAGNOSIS — F419 Anxiety disorder, unspecified: Secondary | ICD-10-CM | POA: Diagnosis not present

## 2018-01-25 DIAGNOSIS — N951 Menopausal and female climacteric states: Secondary | ICD-10-CM | POA: Diagnosis not present

## 2018-01-25 DIAGNOSIS — E039 Hypothyroidism, unspecified: Secondary | ICD-10-CM | POA: Diagnosis not present

## 2018-01-25 DIAGNOSIS — F419 Anxiety disorder, unspecified: Secondary | ICD-10-CM | POA: Diagnosis not present

## 2018-01-25 DIAGNOSIS — R6882 Decreased libido: Secondary | ICD-10-CM | POA: Diagnosis not present

## 2018-02-21 DIAGNOSIS — M9905 Segmental and somatic dysfunction of pelvic region: Secondary | ICD-10-CM | POA: Diagnosis not present

## 2018-02-21 DIAGNOSIS — M545 Low back pain: Secondary | ICD-10-CM | POA: Diagnosis not present

## 2018-02-21 DIAGNOSIS — M9914 Subluxation complex (vertebral) of sacral region: Secondary | ICD-10-CM | POA: Diagnosis not present

## 2018-02-21 DIAGNOSIS — M9903 Segmental and somatic dysfunction of lumbar region: Secondary | ICD-10-CM | POA: Diagnosis not present

## 2018-03-08 DIAGNOSIS — M9914 Subluxation complex (vertebral) of sacral region: Secondary | ICD-10-CM | POA: Diagnosis not present

## 2018-03-08 DIAGNOSIS — M9905 Segmental and somatic dysfunction of pelvic region: Secondary | ICD-10-CM | POA: Diagnosis not present

## 2018-03-08 DIAGNOSIS — M9903 Segmental and somatic dysfunction of lumbar region: Secondary | ICD-10-CM | POA: Diagnosis not present

## 2018-03-08 DIAGNOSIS — M545 Low back pain: Secondary | ICD-10-CM | POA: Diagnosis not present

## 2018-04-17 DIAGNOSIS — E039 Hypothyroidism, unspecified: Secondary | ICD-10-CM | POA: Diagnosis not present

## 2018-04-17 DIAGNOSIS — F419 Anxiety disorder, unspecified: Secondary | ICD-10-CM | POA: Diagnosis not present

## 2018-04-17 DIAGNOSIS — R6882 Decreased libido: Secondary | ICD-10-CM | POA: Diagnosis not present

## 2018-04-17 DIAGNOSIS — M549 Dorsalgia, unspecified: Secondary | ICD-10-CM | POA: Diagnosis not present

## 2018-07-13 DIAGNOSIS — J0140 Acute pansinusitis, unspecified: Secondary | ICD-10-CM | POA: Diagnosis not present

## 2018-07-13 DIAGNOSIS — H1032 Unspecified acute conjunctivitis, left eye: Secondary | ICD-10-CM | POA: Diagnosis not present

## 2018-07-31 DIAGNOSIS — R6882 Decreased libido: Secondary | ICD-10-CM | POA: Diagnosis not present

## 2018-07-31 DIAGNOSIS — N951 Menopausal and female climacteric states: Secondary | ICD-10-CM | POA: Diagnosis not present

## 2018-07-31 DIAGNOSIS — R05 Cough: Secondary | ICD-10-CM | POA: Diagnosis not present

## 2018-07-31 DIAGNOSIS — E039 Hypothyroidism, unspecified: Secondary | ICD-10-CM | POA: Diagnosis not present

## 2018-11-13 DIAGNOSIS — Z20828 Contact with and (suspected) exposure to other viral communicable diseases: Secondary | ICD-10-CM | POA: Diagnosis not present

## 2019-01-15 DIAGNOSIS — N951 Menopausal and female climacteric states: Secondary | ICD-10-CM | POA: Diagnosis not present

## 2019-01-15 DIAGNOSIS — R5381 Other malaise: Secondary | ICD-10-CM | POA: Diagnosis not present

## 2019-01-15 DIAGNOSIS — E039 Hypothyroidism, unspecified: Secondary | ICD-10-CM | POA: Diagnosis not present

## 2019-01-15 DIAGNOSIS — R05 Cough: Secondary | ICD-10-CM | POA: Diagnosis not present

## 2019-01-29 DIAGNOSIS — E039 Hypothyroidism, unspecified: Secondary | ICD-10-CM | POA: Diagnosis not present

## 2019-01-29 DIAGNOSIS — N951 Menopausal and female climacteric states: Secondary | ICD-10-CM | POA: Diagnosis not present

## 2019-01-29 DIAGNOSIS — E785 Hyperlipidemia, unspecified: Secondary | ICD-10-CM | POA: Diagnosis not present

## 2019-01-29 DIAGNOSIS — F329 Major depressive disorder, single episode, unspecified: Secondary | ICD-10-CM | POA: Diagnosis not present

## 2019-04-22 IMAGING — MR MR PELVIS W/O CM
4 of 5 series · 15 of 48 positions shown · non-contrast
Comparison: None.

CLINICAL DATA: Chronic bilateral buttock pain, hip pain

EXAM:
MRI PELVIS WITHOUT CONTRAST
TECHNIQUE: Multiplanar multisequence MR imaging of the pelvis was performed. No
intravenous contrast was administered.

[Series 3: STIR · coronal · 5.0mm · 0.66mm/px · 6 of 28 slices shown]
[im 1/28]
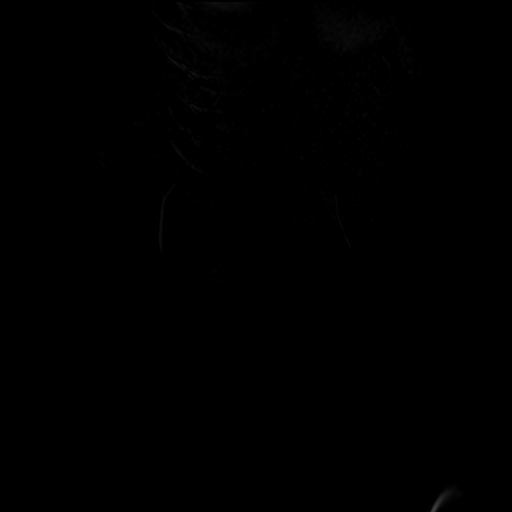
[im 5/28]
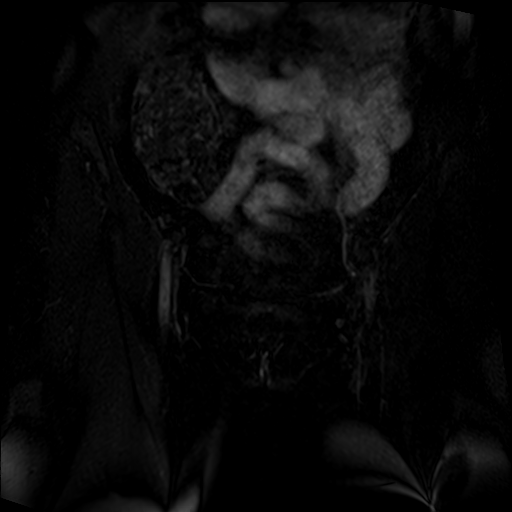
[im 10/28]
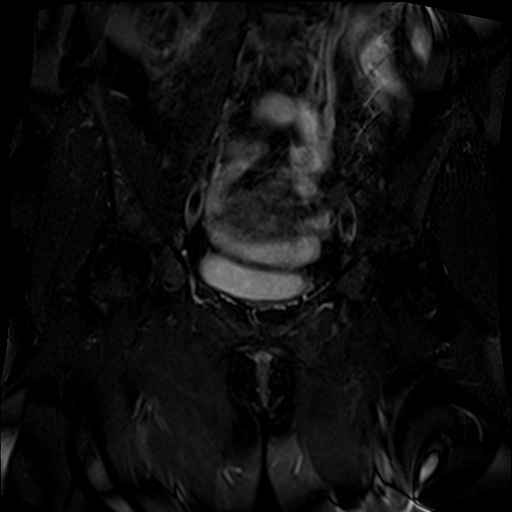
[im 14/28]
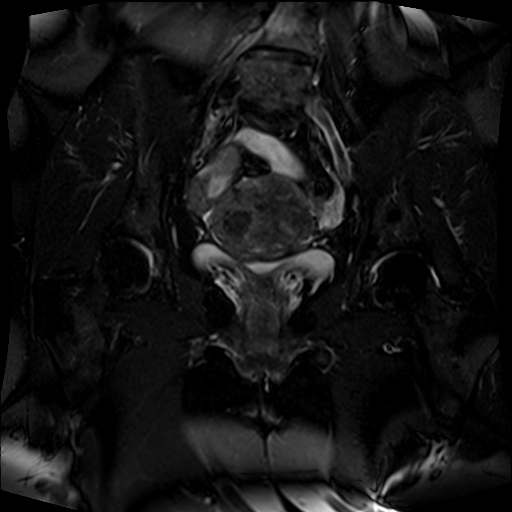
[im 19/28]
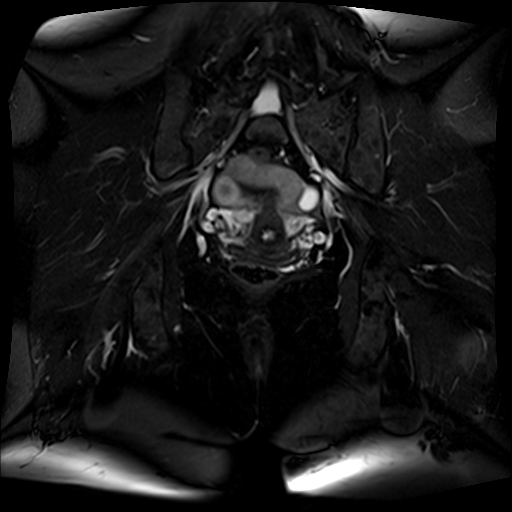
[im 23/28]
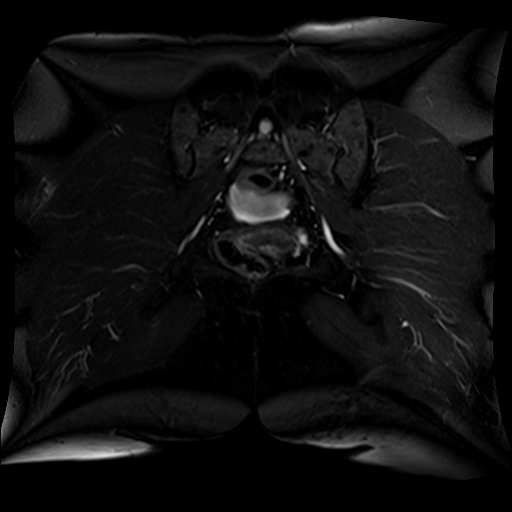

[Series 4: T1 · axial · 5.0mm · 0.53mm/px · z∈[-94,+98]mm · 3 of 40 slices shown (1 of 2)]
[im 4/40]
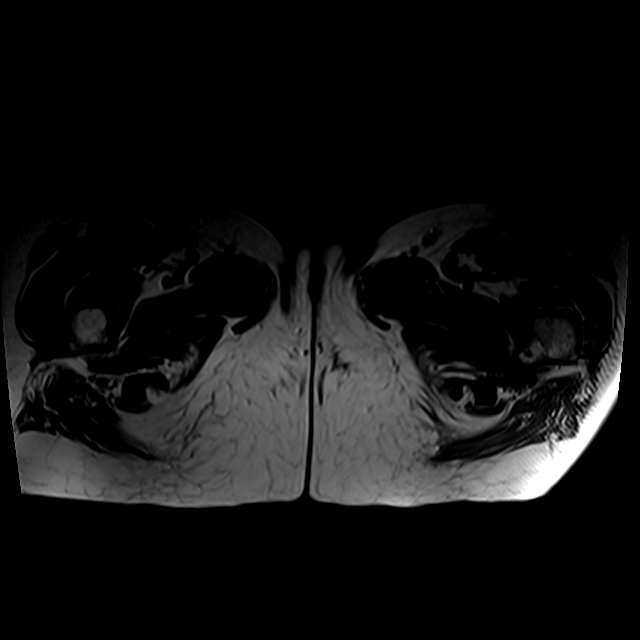
[im 20/40]
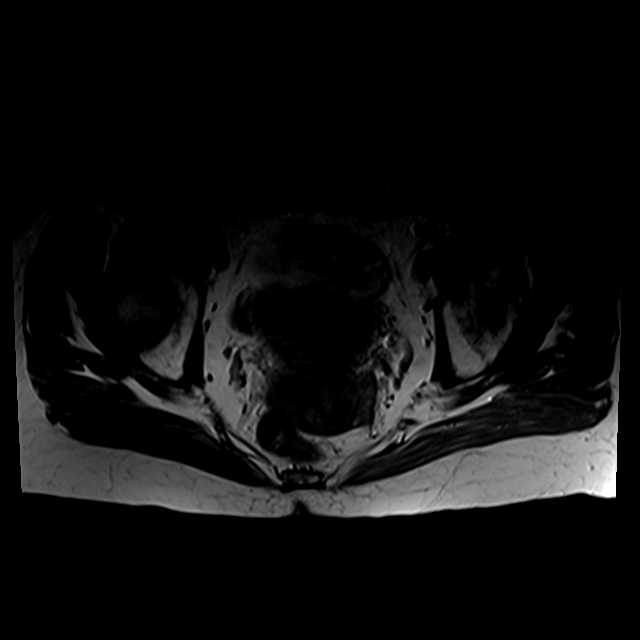
[im 36/40]
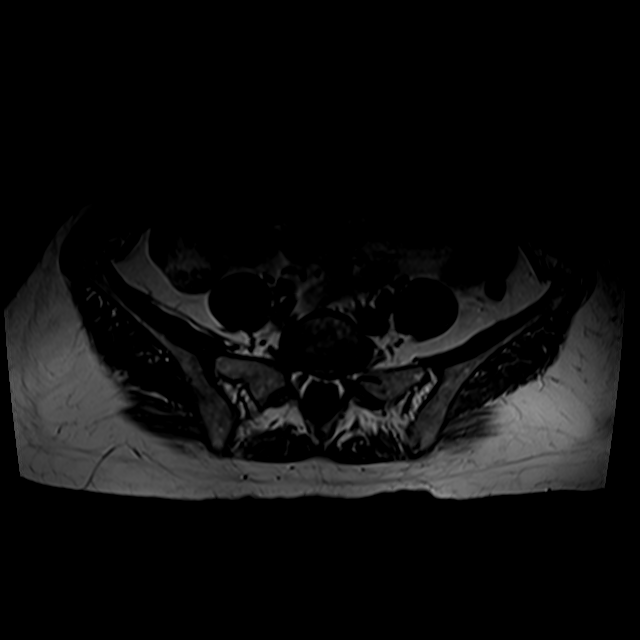

[Series 5: T2 fat-sat · axial · 5.0mm · 0.53mm/px · z∈[-94,+98]mm · 3 of 40 slices shown]
[im 4/40]
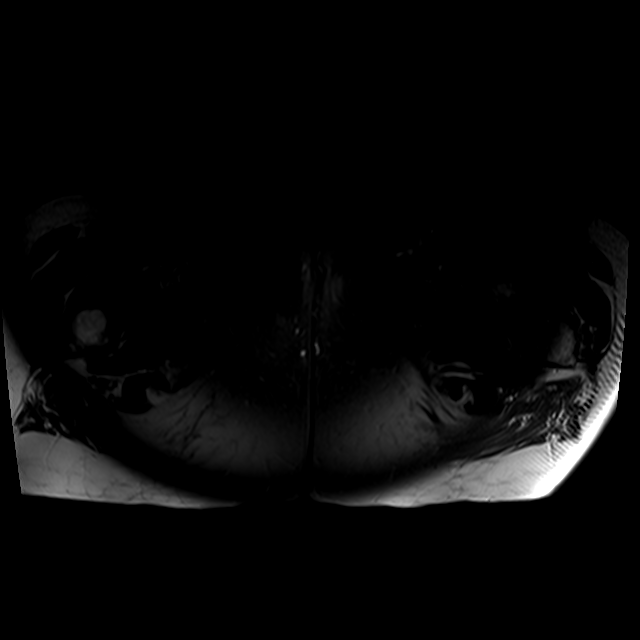
[im 20/40]
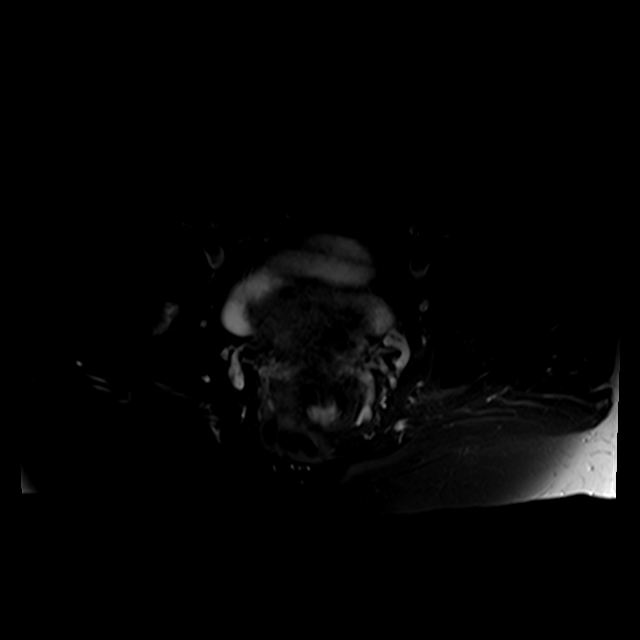
[im 36/40]
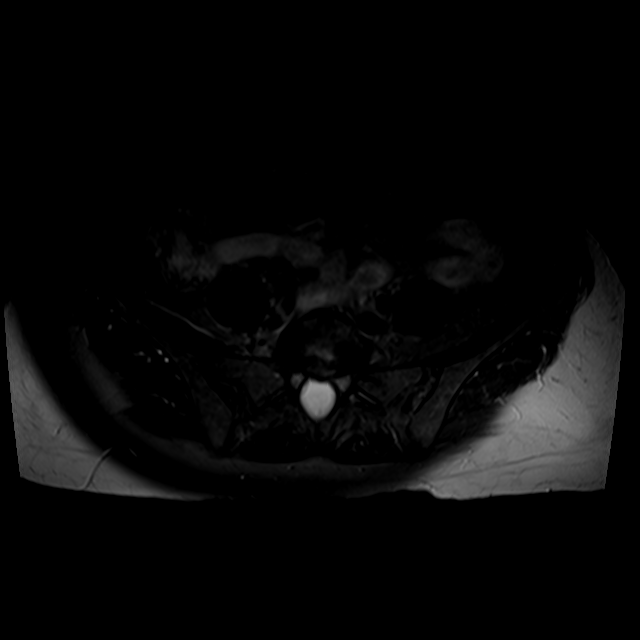

[Series 6: T1 · coronal · 5.0mm · 0.53mm/px · 3 of 28 slices shown (2 of 2)]
[im 5/28]
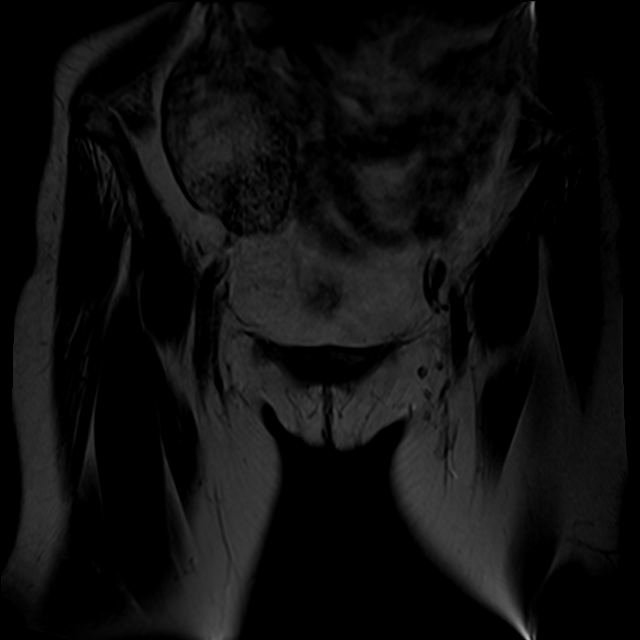
[im 14/28]
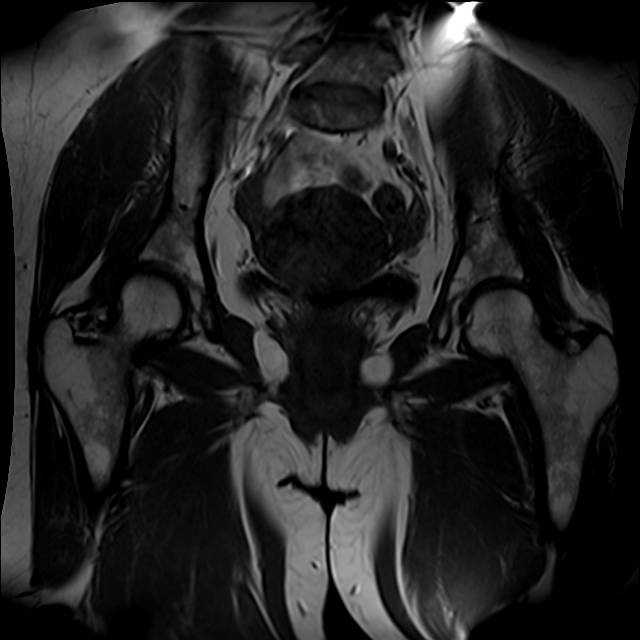
[im 23/28]
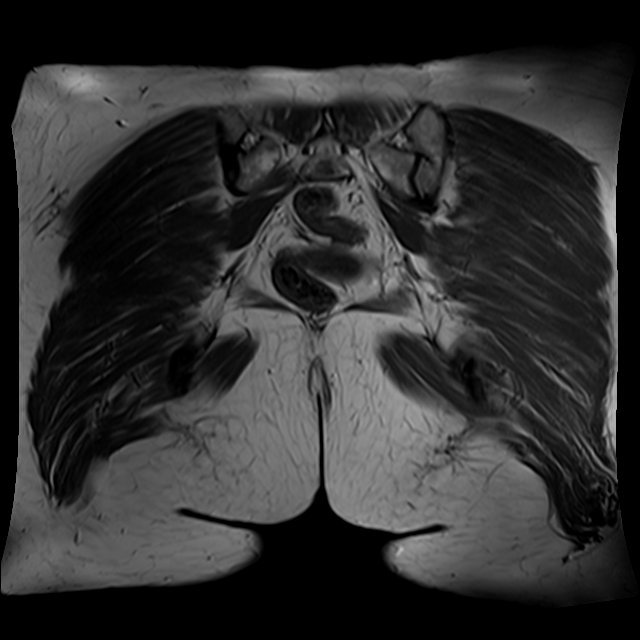

[15 of 48 positions shown; findings below may reference images not displayed]

FINDINGS: Bones: No hip fracture, dislocation or avascular necrosis. No
periosteal reaction or bone destruction. No aggressive osseous
lesion.

Normal sacrum and sacroiliac joints. No SI joint widening or erosive
changes.

Mild broad-based disc bulge at L5-S1.

Articular cartilage and labrum

Articular cartilage:  No chondral defect.

Labrum: Grossly intact, but evaluation is limited by lack of
intraarticular fluid.

Joint or bursal effusion

Joint effusion:  No hip joint effusion.  No SI joint effusion.

Bursae:  No bursa formation.

Muscles and tendons

Flexors: Normal.

Extensors: Normal.

Abductors: Normal.

Adductors: Normal.

Gluteals: Normal.

Hamstrings: Normal.

Other findings

Miscellaneous: No pelvic free fluid. No fluid collection or
hematoma. No inguinal lymphadenopathy. No inguinal hernia. Multiple
uterine masses most consistent with a fibroid uterus.
IMPRESSION: 1. No hip fracture, dislocation or avascular necrosis.
2. Fibroid uterus.
3. Mild broad-based disc bulge at L5-S1.

## 2021-07-02 ENCOUNTER — Other Ambulatory Visit: Payer: Self-pay | Admitting: *Deleted

## 2021-07-02 DIAGNOSIS — N644 Mastodynia: Secondary | ICD-10-CM

## 2021-07-06 ENCOUNTER — Other Ambulatory Visit: Payer: Self-pay | Admitting: *Deleted

## 2021-07-06 ENCOUNTER — Other Ambulatory Visit: Payer: Self-pay | Admitting: Family Medicine

## 2021-07-06 DIAGNOSIS — N644 Mastodynia: Secondary | ICD-10-CM

## 2021-07-29 ENCOUNTER — Ambulatory Visit: Payer: Self-pay

## 2021-07-29 ENCOUNTER — Ambulatory Visit
Admission: RE | Admit: 2021-07-29 | Discharge: 2021-07-29 | Disposition: A | Discharge status: Home / Self Care | Payer: BC Managed Care – PPO | Source: Ambulatory Visit | Attending: Family Medicine | Admitting: Family Medicine

## 2021-07-29 DIAGNOSIS — N644 Mastodynia: Secondary | ICD-10-CM

## 2022-03-16 ENCOUNTER — Emergency Department (HOSPITAL_BASED_OUTPATIENT_CLINIC_OR_DEPARTMENT_OTHER)
Admission: EM | Admit: 2022-03-16 | Discharge: 2022-03-16 | Discharge status: Against Medical Advice | Payer: Self-pay | Attending: Emergency Medicine | Admitting: Emergency Medicine

## 2022-03-16 ENCOUNTER — Emergency Department (HOSPITAL_BASED_OUTPATIENT_CLINIC_OR_DEPARTMENT_OTHER): Payer: Self-pay

## 2022-03-16 ENCOUNTER — Other Ambulatory Visit: Payer: Self-pay

## 2022-03-16 DIAGNOSIS — R1030 Lower abdominal pain, unspecified: Secondary | ICD-10-CM | POA: Insufficient documentation

## 2022-03-16 DIAGNOSIS — R319 Hematuria, unspecified: Secondary | ICD-10-CM | POA: Insufficient documentation

## 2022-03-16 DIAGNOSIS — R350 Frequency of micturition: Secondary | ICD-10-CM | POA: Insufficient documentation

## 2022-03-16 DIAGNOSIS — R109 Unspecified abdominal pain: Secondary | ICD-10-CM

## 2022-03-16 LAB — URINALYSIS, ROUTINE W REFLEX MICROSCOPIC
Glucose, UA: NEGATIVE mg/dL
Ketones, ur: >=80 mg/dL — AB
Leukocytes,Ua: NEGATIVE
Nitrite: NEGATIVE
Protein, ur: NEGATIVE mg/dL
Specific Gravity, Urine: 1.02 (ref 1.005–1.030)
pH: 7 (ref 5.0–8.0)

## 2022-03-16 LAB — URINALYSIS, MICROSCOPIC (REFLEX)

## 2022-03-16 NOTE — ED Provider Notes (Signed)
[facility] EMERGENCY DEPARTMENT Provider Note   CSN: [id number] Arrival date & time: 03/16/22  0045     History  Chief Complaint  Patient presents with   Urinary Frequency   Flank Pain    Kiara Lewis is a 66 y.o. female.  The history is provided by the patient.  Urinary Frequency The current episode started more than 1 week ago (more than a month ago with intermittent urinary hesitancy). The problem has not changed since onset.Pertinent negatives include no chest pain, no abdominal pain, no headaches and no shortness of breath. Associated symptoms comments: Flank pain . Nothing aggravates the symptoms. Nothing relieves the symptoms. She has tried nothing for the symptoms. The treatment provided no relief.  Flank Pain This is a new problem. The current episode started 1 to 2 hours ago. The problem occurs constantly. The problem has been resolved. Pertinent negatives include no chest pain, no abdominal pain, no headaches and no shortness of breath. Nothing relieves the symptoms. She has tried nothing for the symptoms. The treatment provided no relief.  Patient presents with urinary frequency and hesitancy for one month or so off and on and R flank pain tonight.     No past medical history on file.   Home Medications Prior to Admission medications   Medication Sig Start Date End Date Taking? Authorizing Provider  diazepam (VALIUM) 10 MG tablet Take 1 tablet (10 mg total) by mouth at bedtime as needed for sleep. 09/27/16   Judi Saa, DO  estradiol (ESTRACE) 2 MG tablet Take 2 mg by mouth daily. 11/22/13   [provider]  l-methylfolate-B6-B12 (METANX) 3-35-2 MG TABS tablet Take 1 tablet by mouth daily.    [provider]  magnesium 30 MG tablet Take 30 mg by mouth 2 (two) times daily.    [provider]  progesterone (PROMETRIUM) 100 MG capsule Take 100 mg by mouth daily. 12/06/13   [provider]  Turmeric Curcumin 500 MG CAPS  Take 1 capsule by mouth daily.    [provider]  venlafaxine XR (EFFEXOR-XR) 75 MG 24 hr capsule Take 1 capsule (75 mg total) by mouth 2 (two) times daily. 07/11/17   Enid Baas, MD  Vitamin D, Ergocalciferol, (DRISDOL) 50000 units CAPS capsule Take 1 capsule (50,000 Units total) by mouth every 7 (seven) days. 09/22/16   Judi Saa, DO      Allergies    Patient has no known allergies.    Review of Systems   Review of Systems  Constitutional:  Negative for fever.  HENT:  Negative for facial swelling.   Respiratory:  Negative for shortness of breath.   Cardiovascular:  Negative for chest pain.  Gastrointestinal:  Negative for abdominal pain.  Genitourinary:  Positive for flank pain and frequency. Negative for hematuria.  Neurological:  Negative for headaches.  All other systems reviewed and are negative.   Physical Exam Updated Vital Signs BP 128/78 (BP Location: Left Arm)   Pulse 82   Temp 98.8 F (37.1 C)   Resp 18   Ht 5\' 5"  (1.651 m)   Wt 58.1 kg   SpO2 98%   BMI 21.30 kg/m  Physical Exam Vitals and nursing note reviewed.  Constitutional:      General: She is not in acute distress.    Appearance: Normal appearance. She is well-developed.  HENT:     Head: Normocephalic and atraumatic.     Nose: Nose normal.  Eyes:  Pupils: Pupils are equal, round, and reactive to light.  Cardiovascular:     Rate and Rhythm: Normal rate and regular rhythm.     Pulses: Normal pulses.     Heart sounds: Normal heart sounds.  Pulmonary:     Effort: Pulmonary effort is normal. No respiratory distress.     Breath sounds: Normal breath sounds.  Abdominal:     General: Bowel sounds are normal. There is no distension.     Palpations: Abdomen is soft.     Tenderness: There is no abdominal tenderness. There is no guarding or rebound.  Genitourinary:    Vagina: No vaginal discharge.  Musculoskeletal:        General: Normal range of motion.     Cervical back: Normal  range of motion and neck supple.  Skin:    General: Skin is warm and dry.     Capillary Refill: Capillary refill takes less than 2 seconds.     Findings: No erythema or rash.  Neurological:     General: No focal deficit present.     Mental Status: She is alert and oriented to person, place, and time.     Deep Tendon Reflexes: Reflexes normal.  Psychiatric:        Mood and Affect: Mood normal.        Behavior: Behavior normal.        Thought Content: Thought content normal.     ED Results / Procedures / Treatments   Labs (all labs ordered are listed, but only abnormal results are displayed) Results for orders placed or performed during the hospital encounter of 03/16/22  Urinalysis, Routine w reflex microscopic Urine, Clean Catch  Result Value Ref Range   Color, Urine YELLOW YELLOW   APPearance CLEAR CLEAR   Specific Gravity, Urine 1.020 1.005 - 1.030   pH 7.0 5.0 - 8.0   Glucose, UA NEGATIVE NEGATIVE mg/dL   Hgb urine dipstick MODERATE (A) NEGATIVE   Bilirubin Urine SMALL (A) NEGATIVE   Ketones, ur >=80 (A) NEGATIVE mg/dL   Protein, ur NEGATIVE NEGATIVE mg/dL   Nitrite NEGATIVE NEGATIVE   Leukocytes,Ua NEGATIVE NEGATIVE  Urinalysis, Microscopic (reflex)  Result Value Ref Range   RBC / HPF 21-50 0 - 5 RBC/hpf   WBC, UA 0-5 0 - 5 WBC/hpf   Bacteria, UA FEW (A) NONE SEEN   Squamous Epithelial / LPF 0-5 0 - 5   Mucus PRESENT    No results found.  EKG None  Radiology No results found.  Procedures Procedures    Medications Ordered in ED Medications - No data to display  ED Course/ Medical Decision Making/ A&P                           Medical Decision Making Patient with 1 month of urinary symptoms and then flank pain tonight   Problems Addressed: Flank pain:    Details: CT was ordered immediately and patient then refused and decided to leave against medical advice  Hematuria, unspecified type:    Details: CT ordered prior to urine results for symptoms.   Patient refused this due to cost and then decided to leave against medical advice ash she needed to be in court in the am   Amount and/or Complexity of Data Reviewed External Data Reviewed: notes.    Details: Previous notes reviewed  Labs: ordered.    Details: Urine has hemoglobin in it and has ketones.  Not consistent with  UTI Radiology: ordered.  Risk Risk Details: Patient declined CT, nurse asked EDP to speak with patient regarding this.  EDP went to room and sat and had a long discussion about differential and plan of care.  Patient states she is pain free and needs to be in court this am and needs to get sleep.  She does not want the scan even though it may provide an answer and further plan of treatment secondary to cost and time.  She needs to get home to sleep for court in am.  EDP explained that this could be something serious and is not a UTI as patient thought.  EDP explained to patient that my goal was to help her to feel better and to be able to tell her why she had this pain and whether it would return.  She considered this and is AO4 and has decision making capacity to refuse care.  I explained that the risks of leaving against medical advice are but are not limited to: death, coma, sepsis, severe morbidity secondary to underlying conditions and recurrent pain.  She verbalizes understanding of all these risks and still wishes to leave. She is welcome to return at any time > 10 minutes spent in discussion with patient regarding imaging.     iagnoses Final diagnoses:  Flank pain  Hematuria, unspecified type   Return for intractable cough, coughing up blood, fevers > 100.4 unrelieved by medication, shortness of breath, intractable vomiting, chest pain, shortness of breath, weakness, numbness, changes in speech, facial asymmetry, abdominal pain, passing out, Inability to tolerate liquids or food, cough, altered mental status or any concerns. No signs of systemic illness or infection. The  patient is nontoxic-appearing on exam and vital signs are within normal limits.  I have reviewed the triage vital signs and the nursing notes. Pertinent labs & imaging results that were available during my care of the patient were reviewed by me and considered in my medical decision making (see chart for details). After history, exam, and medical workup I feel the patient has been appropriately medically screened and is safe for discharge home. Pertinent diagnoses were discussed with the patient. Patient was given return precautions.    Rx / DC Orders ED Discharge Orders     None         Jaciel Diem, MD 03/16/22 [phone]

## 2022-03-16 NOTE — ED Triage Notes (Signed)
Pt reports urinary issues with frequency and only going small amounts. Pt reports right side flank pain that hit all of the sudden tonight. Pt had one episode of emesis.

## 2024-05-09 ENCOUNTER — Other Ambulatory Visit: Payer: Self-pay | Admitting: Family Medicine

## 2024-05-09 ENCOUNTER — Other Ambulatory Visit: Payer: Self-pay

## 2024-05-09 DIAGNOSIS — Z1231 Encounter for screening mammogram for malignant neoplasm of breast: Secondary | ICD-10-CM

## 2024-05-14 ENCOUNTER — Inpatient Hospital Stay
Admission: RE | Admit: 2024-05-14 | Discharge: 2024-05-14 | Payer: Self-pay | Attending: Family Medicine | Admitting: Family Medicine

## 2024-05-14 DIAGNOSIS — Z1231 Encounter for screening mammogram for malignant neoplasm of breast: Secondary | ICD-10-CM
